# Patient Record
Sex: Male | Born: 1960 | Race: White | Hispanic: No | Marital: Single | Smoking: Never smoker
Health system: Southern US, Community
[De-identification: ages and names within clinical notes are randomized; demographics above are authoritative.]

## PROBLEM LIST (undated history)

## (undated) DIAGNOSIS — F79 Unspecified intellectual disabilities: Secondary | ICD-10-CM

## (undated) DIAGNOSIS — F259 Schizoaffective disorder, unspecified: Secondary | ICD-10-CM

## (undated) DIAGNOSIS — Z973 Presence of spectacles and contact lenses: Secondary | ICD-10-CM

## (undated) DIAGNOSIS — R569 Unspecified convulsions: Secondary | ICD-10-CM

## (undated) HISTORY — PX: CHOLECYSTECTOMY: SHX55

---

## 2010-03-30 ENCOUNTER — Inpatient Hospital Stay (HOSPITAL_COMMUNITY): Admission: EM | Admit: 2010-03-30 | Discharge: 2010-04-01 | Payer: Self-pay | Admitting: Emergency Medicine

## 2010-03-31 ENCOUNTER — Encounter (INDEPENDENT_AMBULATORY_CARE_PROVIDER_SITE_OTHER): Payer: Self-pay | Admitting: Internal Medicine

## 2010-12-25 LAB — CBC
HCT: 46.1 % (ref 39.0–52.0)
Hemoglobin: 16.2 g/dL (ref 13.0–17.0)
MCHC: 34.8 g/dL (ref 30.0–36.0)
MCHC: 34.8 g/dL (ref 30.0–36.0)
MCV: 91.5 fL (ref 78.0–100.0)
Platelets: 92 10*3/uL — ABNORMAL LOW (ref 150–400)
Platelets: 93 10*3/uL — ABNORMAL LOW (ref 150–400)
RBC: 4.99 MIL/uL (ref 4.22–5.81)
RBC: 5.04 MIL/uL (ref 4.22–5.81)
RDW: 13.4 % (ref 11.5–15.5)
RDW: 13.9 % (ref 11.5–15.5)
WBC: 6.6 10*3/uL (ref 4.0–10.5)

## 2010-12-25 LAB — CULTURE, BLOOD (ROUTINE X 2): Culture: NO GROWTH

## 2010-12-25 LAB — DIFFERENTIAL
Basophils Absolute: 0 10*3/uL (ref 0.0–0.1)
Basophils Relative: 0 % (ref 0–1)
Eosinophils Absolute: 0.2 10*3/uL (ref 0.0–0.7)
Lymphs Abs: 1.1 10*3/uL (ref 0.7–4.0)
Monocytes Absolute: 0.7 10*3/uL (ref 0.1–1.0)
Monocytes Relative: 10 % (ref 3–12)
Neutro Abs: 9.3 10*3/uL — ABNORMAL HIGH (ref 1.7–7.7)
Neutrophils Relative %: 71 % (ref 43–77)
Neutrophils Relative %: 91 % — ABNORMAL HIGH (ref 43–77)

## 2010-12-25 LAB — RAPID URINE DRUG SCREEN, HOSP PERFORMED: Cocaine: NOT DETECTED

## 2010-12-25 LAB — URINALYSIS, ROUTINE W REFLEX MICROSCOPIC
Glucose, UA: 250 mg/dL — AB
Protein, ur: NEGATIVE mg/dL
Urobilinogen, UA: 0.2 mg/dL (ref 0.0–1.0)

## 2010-12-25 LAB — BLOOD GAS, ARTERIAL
Bicarbonate: 25.4 mEq/L — ABNORMAL HIGH (ref 20.0–24.0)
Patient temperature: 97.8
TCO2: 26.7 mmol/L (ref 0–100)
pH, Arterial: 7.42 (ref 7.350–7.450)
pO2, Arterial: 84.1 mmHg (ref 80.0–100.0)

## 2010-12-25 LAB — IRON AND TIBC: Iron: 116 ug/dL (ref 42–135)

## 2010-12-25 LAB — COMPREHENSIVE METABOLIC PANEL
ALT: 28 U/L (ref 0–53)
ALT: 36 U/L (ref 0–53)
AST: 20 U/L (ref 0–37)
Albumin: 3.3 g/dL — ABNORMAL LOW (ref 3.5–5.2)
Albumin: 3.9 g/dL (ref 3.5–5.2)
Alkaline Phosphatase: 67 U/L (ref 39–117)
CO2: 27 mEq/L (ref 19–32)
Calcium: 8.5 mg/dL (ref 8.4–10.5)
Calcium: 8.7 mg/dL (ref 8.4–10.5)
Chloride: 104 mEq/L (ref 96–112)
GFR calc Af Amer: >60 mL/min (ref >60)
GFR calc non Af Amer: >60 mL/min (ref >60)
Glucose, Bld: 132 mg/dL — ABNORMAL HIGH (ref 70–99)
Glucose, Bld: 93 mg/dL (ref 70–99)
Potassium: 3.6 mEq/L (ref 3.5–5.1)
Sodium: 135 mEq/L (ref 135–145)
Sodium: 140 mEq/L (ref 135–145)
Total Bilirubin: 1.1 mg/dL (ref 0.3–1.2)
Total Protein: 5.5 g/dL — ABNORMAL LOW (ref 6.0–8.3)
Total Protein: 6.4 g/dL (ref 6.0–8.3)

## 2010-12-25 LAB — MRSA PCR SCREENING
MRSA by PCR: NEGATIVE
MRSA by PCR: NEGATIVE

## 2010-12-25 LAB — GLUCOSE, CAPILLARY
Glucose-Capillary: 102 mg/dL — ABNORMAL HIGH (ref 70–99)
Glucose-Capillary: 107 mg/dL — ABNORMAL HIGH (ref 70–99)
Glucose-Capillary: 113 mg/dL — ABNORMAL HIGH (ref 70–99)
Glucose-Capillary: 136 mg/dL — ABNORMAL HIGH (ref 70–99)
Glucose-Capillary: 150 mg/dL — ABNORMAL HIGH (ref 70–99)
Glucose-Capillary: 169 mg/dL — ABNORMAL HIGH (ref 70–99)

## 2010-12-25 LAB — LIPID PANEL
Cholesterol: 194 mg/dL (ref 0–200)
HDL: 57 mg/dL (ref >39)
LDL Cholesterol: 124 mg/dL — ABNORMAL HIGH (ref 0–99)
Total CHOL/HDL Ratio: 3.4 RATIO

## 2010-12-25 LAB — POCT I-STAT, CHEM 8
BUN: 10 mg/dL (ref 6–23)
Chloride: 101 mEq/L (ref 96–112)
Creatinine, Ser: 0.9 mg/dL (ref 0.4–1.5)
HCT: 47 % (ref 39.0–52.0)
Hemoglobin: 16 g/dL (ref 13.0–17.0)
Potassium: 4 mEq/L (ref 3.5–5.1)
TCO2: 27 mmol/L (ref 0–100)

## 2010-12-25 LAB — DIC (DISSEMINATED INTRAVASCULAR COAGULATION)PANEL
Fibrinogen: 338 mg/dL (ref 204–475)
INR: 0.99 (ref 0.00–1.49)
Smear Review: NONE SEEN
aPTT: 29 seconds (ref 24–37)

## 2010-12-25 LAB — URINE CULTURE: Culture: NO GROWTH

## 2010-12-25 LAB — AMMONIA: Ammonia: 18 umol/L (ref 11–35)

## 2010-12-25 LAB — TSH: TSH: 0.516 u[IU]/mL (ref 0.350–4.500)

## 2010-12-25 LAB — CARDIAC PANEL(CRET KIN+CKTOT+MB+TROPI)
Relative Index: INVALID (ref 0.0–2.5)
Troponin I: 0.02 ng/mL (ref 0.00–0.06)

## 2010-12-25 LAB — CK TOTAL AND CKMB (NOT AT ARMC)
CK, MB: 2.1 ng/mL (ref 0.3–4.0)
Relative Index: 1.8 (ref 0.0–2.5)
Total CK: 116 U/L (ref 7–232)

## 2010-12-25 LAB — FERRITIN: Ferritin: 194 ng/mL (ref 22–322)

## 2010-12-25 LAB — TECHNOLOGIST SMEAR REVIEW

## 2010-12-25 LAB — RETICULOCYTES: Retic Ct Pct: 1.1 % (ref 0.4–3.1)

## 2010-12-25 LAB — VITAMIN A: Vitamin A (Retinoic Acid): 72 ug/dL (ref 38–98)

## 2010-12-25 LAB — FOLATE: Folate: >20 ng/mL

## 2014-10-13 ENCOUNTER — Emergency Department (HOSPITAL_COMMUNITY)
Admission: EM | Admit: 2014-10-13 | Discharge: 2014-10-13 | Disposition: A | Discharge status: Home / Self Care | Payer: Medicare Other | Attending: Emergency Medicine | Admitting: Emergency Medicine

## 2014-10-13 ENCOUNTER — Emergency Department (HOSPITAL_COMMUNITY): Payer: Medicare Other

## 2014-10-13 ENCOUNTER — Encounter (HOSPITAL_COMMUNITY): Payer: Self-pay | Admitting: Emergency Medicine

## 2014-10-13 DIAGNOSIS — Y9389 Activity, other specified: Secondary | ICD-10-CM | POA: Insufficient documentation

## 2014-10-13 DIAGNOSIS — W19XXXA Unspecified fall, initial encounter: Secondary | ICD-10-CM

## 2014-10-13 DIAGNOSIS — S52611A Displaced fracture of right ulna styloid process, initial encounter for closed fracture: Secondary | ICD-10-CM | POA: Insufficient documentation

## 2014-10-13 DIAGNOSIS — M25531 Pain in right wrist: Secondary | ICD-10-CM | POA: Diagnosis not present

## 2014-10-13 DIAGNOSIS — F79 Unspecified intellectual disabilities: Secondary | ICD-10-CM | POA: Insufficient documentation

## 2014-10-13 DIAGNOSIS — Z7951 Long term (current) use of inhaled steroids: Secondary | ICD-10-CM | POA: Diagnosis not present

## 2014-10-13 DIAGNOSIS — Y998 Other external cause status: Secondary | ICD-10-CM | POA: Insufficient documentation

## 2014-10-13 DIAGNOSIS — S6991XA Unspecified injury of right wrist, hand and finger(s), initial encounter: Secondary | ICD-10-CM | POA: Diagnosis present

## 2014-10-13 DIAGNOSIS — S52571A Other intraarticular fracture of lower end of right radius, initial encounter for closed fracture: Secondary | ICD-10-CM | POA: Diagnosis not present

## 2014-10-13 DIAGNOSIS — Y92128 Other place in nursing home as the place of occurrence of the external cause: Secondary | ICD-10-CM | POA: Diagnosis not present

## 2014-10-13 DIAGNOSIS — Z79899 Other long term (current) drug therapy: Secondary | ICD-10-CM | POA: Insufficient documentation

## 2014-10-13 DIAGNOSIS — S52501A Unspecified fracture of the lower end of right radius, initial encounter for closed fracture: Secondary | ICD-10-CM

## 2014-10-13 DIAGNOSIS — W010XXA Fall on same level from slipping, tripping and stumbling without subsequent striking against object, initial encounter: Secondary | ICD-10-CM | POA: Insufficient documentation

## 2014-10-13 HISTORY — DX: Schizoaffective disorder, unspecified: F25.9

## 2014-10-13 HISTORY — DX: Unspecified intellectual disabilities: F79

## 2014-10-13 MED ORDER — HYDROCODONE-ACETAMINOPHEN 7.5-325 MG PO TABS: 1.0000 | ORAL_TABLET | ORAL | Status: DC | PRN

## 2014-10-13 MED ORDER — HYDROMORPHONE HCL 1 MG/ML IJ SOLN: 1.0000 mg | Freq: Once | INTRAMUSCULAR | Status: DC

## 2014-10-13 MED ORDER — HYDROCODONE-ACETAMINOPHEN 5-325 MG PO TABS
2.0000 | ORAL_TABLET | Freq: Once | ORAL | Status: AC
  Administered 2014-10-13: 2 via ORAL
  Filled 2014-10-13: qty 2

## 2014-10-13 MED ORDER — ONDANSETRON HCL 4 MG PO TABS
4.0000 mg | ORAL_TABLET | Freq: Once | ORAL | Status: AC
  Administered 2014-10-13: 4 mg via ORAL
  Filled 2014-10-13: qty 1

## 2014-10-13 NOTE — Discharge Instructions (Signed)
Gerald Pierce has a fracture of the wrist/forearm area that involves the joint. Please see Dr. Merlyn LotKuzma tomorrow for orthopedic evaluation and management. Please use Tylenol or ibuprofen for mild pain, use Norco for more severe pain. Norco may cause drowsiness, please use with caution. Please apply ice, and use the sling. Radial Fracture You have a broken bone (fracture) of the forearm. This is the part of your arm between the elbow and your wrist. Your forearm is made up of two bones. These are the radius and ulna. Your fracture is in the radial shaft. This is the bone in your forearm located on the thumb side. A cast or splint is used to protect and keep your injured bone from moving. The cast or splint will be on generally for about 5 to 6 weeks, with individual variations. HOME CARE INSTRUCTIONS   Keep the injured part elevated while sitting or lying down. Keep the injury above the level of your heart (the center of the chest). This will decrease swelling and pain.  Apply ice to the injury for 15-20 minutes, 03-04 times per day while awake, for 2 days. Put the ice in a plastic bag and place a towel between the bag of ice and your cast or splint.  Move your fingers to avoid stiffness and minimize swelling.  If you have a plaster or fiberglass cast:  Do not try to scratch the skin under the cast using sharp or pointed objects.  Check the skin around the cast every day. You may put lotion on any red or sore areas.  Keep your cast dry and clean.  If you have a plaster splint:  Wear the splint as directed.  You may loosen the elastic around the splint if your fingers become numb, tingle, or turn cold or blue.  Do not put pressure on any part of your cast or splint. It may break. Rest your cast only on a pillow for the first 24 hours until it is fully hardened.  Your cast or splint can be protected during bathing with a plastic bag. Do not lower the cast or splint into water.  Only take  over-the-counter or prescription medicines for pain, discomfort, or fever as directed by your caregiver. SEEK IMMEDIATE MEDICAL CARE IF:   Your cast gets damaged or breaks.  You have more severe pain or swelling than you did before getting the cast.  You have severe pain when stretching your fingers.  There is a bad smell, new stains and/or pus-like (purulent) drainage coming from under the cast.  Your fingers or hand turn pale or blue and become cold or your loose feeling. Document Released: 03/08/2006 Document Revised: 12/18/2011 Document Reviewed: 06/04/2006 [facility] Patient Information 2015 [employer], [place]. This information is not intended to replace advice given to you by your health care provider. Make sure you discuss any questions you have with your health care provider.

## 2014-10-13 NOTE — ED Notes (Signed)
Patient with no complaints at this time. Respirations even and unlabored. Skin warm/dry. Discharge instructions reviewed with patient at this time. Patient given opportunity to voice concerns/ask questions. Patient discharged at this time and left Emergency Department with steady gait.   

## 2014-10-13 NOTE — ED Provider Notes (Signed)
CSN: [id number]     Arrival date & time 10/13/14  0905 History   First MD Initiated Contact with Patient 10/13/14 0914     Chief Complaint  Patient presents with  . Wrist Pain     (Consider location/radiation/quality/duration/timing/severity/associated sxs/prior Treatment) HPI Comments: Patient stumbled and fell early this morning on an outstretched hand and injured the right wrist. The patient denies being on any anticoagulation medications. He has not had any previous operations procedures involving the right upper extremity. He has no history of any bleeding disorders.  Patient is a 54 y.o. male presenting with wrist pain. The history is provided by a caregiver and the patient.  Wrist Pain This is a new problem. The current episode started today. The problem occurs constantly. The problem has been gradually worsening. Associated symptoms include arthralgias. Pertinent negatives include no abdominal pain, chest pain, coughing, nausea, neck pain, numbness or vomiting. Exacerbated by: movement of wrist. He has tried nothing for the symptoms. The treatment provided no relief.    Past Medical History  Diagnosis Date  . Schizoaffective disorder   . Mental retardation    Past Surgical History  Procedure Laterality Date  . Cholecystectomy     No family history on file. History  Substance Use Topics  . Smoking status: Never Smoker   . Smokeless tobacco: Not on file  . Alcohol Use: No    Review of Systems  Constitutional: Negative for activity change.       All ROS Neg except as noted in HPI  HENT: Negative for nosebleeds.   Eyes: Negative for photophobia and discharge.  Respiratory: Negative for cough, shortness of breath and wheezing.   Cardiovascular: Negative for chest pain and palpitations.  Gastrointestinal: Negative for nausea, vomiting, abdominal pain and blood in stool.  Genitourinary: Negative for dysuria, frequency and hematuria.  Musculoskeletal: Positive for  arthralgias. Negative for back pain and neck pain.  Skin: Negative.   Neurological: Negative for dizziness, seizures, speech difficulty and numbness.  Psychiatric/Behavioral: Negative for hallucinations and confusion.      Allergies  Review of patient's allergies indicates no known allergies.  Home Medications   Prior to Admission medications   Medication Sig Start Date End Date Taking? Authorizing Provider  alum & mag hydroxide-simeth (MAALOX/MYLANTA) 200-200-20 MG/5ML suspension Take by mouth every 6 (six) hours as needed for indigestion or heartburn.   Yes Historical Provider, MD  betamethasone valerate (VALISONE) 0.1 % cream Apply topically 2 (two) times daily.   Yes Historical Provider, MD  bisacodyl (DULCOLAX) 5 MG EC tablet Take 5 mg by mouth daily as needed for moderate constipation.   Yes Historical Provider, MD  fexofenadine (ALLEGRA) 180 MG tablet Take 180 mg by mouth daily.   Yes Historical Provider, MD  fluticasone (VERAMYST) 27.5 MCG/SPRAY nasal spray Place 2 sprays into the nose daily.   Yes Historical Provider, MD  ibuprofen (ADVIL,MOTRIN) 800 MG tablet Take 800 mg by mouth every 8 (eight) hours as needed.   Yes Historical Provider, MD  lurasidone (LATUDA) 40 MG TABS tablet Take 20 mg by mouth daily at 6 PM.    Yes Historical Provider, MD  simvastatin (ZOCOR) 40 MG tablet Take 40 mg by mouth daily at 6 PM.    Yes Historical Provider, MD  vitamin C (ASCORBIC ACID) 500 MG tablet Take 500 mg by mouth daily.   Yes Historical Provider, MD   BP 127/70 mmHg  Pulse 80  Temp(Src) 97.7 F (36.5 C) (Oral)  Resp 14  Ht 5\' 6"  (1.676 m)  Wt 156 lb (70.761 kg)  BMI 25.19 kg/m2  SpO2 100% Physical Exam  Constitutional: He is oriented to person, place, and time. He appears well-developed and well-nourished.  Non-toxic appearance.  HENT:  Head: Normocephalic.  Right Ear: Tympanic membrane and external ear normal.  Left Ear: Tympanic membrane and external ear normal.  Eyes: EOM  and lids are normal. Pupils are equal, round, and reactive to light.  Neck: Normal range of motion. Neck supple. Carotid bruit is not present.  Cardiovascular: Normal rate, regular rhythm, normal heart sounds, intact distal pulses and normal pulses.   Pulmonary/Chest: Breath sounds normal. No respiratory distress.  Abdominal: Soft. Bowel sounds are normal. There is no tenderness. There is no guarding.  Musculoskeletal: Normal range of motion.  No clavicle deformity. Good ROM of the right shoulder. No deformity of the humerus or elbow. Deformity of the right wrist. FROM of the fingers of the right hand. Cap refill is less than 2 sec. Radial pulse 2+.  Lymphadenopathy:       Head (right side): No submandibular adenopathy present.       Head (left side): No submandibular adenopathy present.    He has no cervical adenopathy.  Neurological: He is alert and oriented to person, place, and time. He has normal strength. No cranial nerve deficit or sensory deficit.  Skin: Skin is warm and dry.  Psychiatric: He has a normal mood and affect. His speech is normal.  Nursing note and vitals reviewed.   ED Course  Procedures (including critical care time) Labs Review Labs Reviewed - No data to display  Imaging Review Dg Wrist Complete Right  10/13/2014   CLINICAL DATA:  Fall  EXAM: RIGHT WRIST - COMPLETE 3+ VIEW  COMPARISON:  None.  FINDINGS: Comminuted intra-articular fracture distal radius with dorsal displacement of fracture fragments. Small avulsion fracture of the ulnar styloid.  IMPRESSION: Comminuted intra-articular fracture distal radius with dorsal displacement.   Electronically Signed   By: Marlan Palauharles  Clark M.D.   On: 10/13/2014 09:49     EKG Interpretation None      MDM  Vital signs within normal limits. Pulse oximetry is 100% on room air. Within normal limits by my interpretation.  No neurovascular compromise noted of the right upper extremity.  X-ray of the right wrist reveals a  comminuted intra-articular fracture of the distal radius with dorsal displacement, there is also an avulsion fracture of the ulnar styloid. The case was discussed with mother Romeo AppleHarrison (orthopedics). He requests that the patient be evaluated by hand specialist. Case discussed with Dr. Merlyn LotKuzma, he requests patient be placed in a sugar tong splint and he will see the patient tomorrow morning in the office.  Patient will be treated with Norco 7.5 mg every 6 hours. Ice pack is been provided, sling is been provided, the patient is given instructions to see Dr. Merlyn LotKuzma tomorrow morning. The caregiver with the patient acknowledges understanding and [place].    Final diagnoses:  Fall    *I have reviewed nursing notes, vital signs, and all appropriate lab and imaging results for this patient.[address]**    Keymora Grillot M Lyndol Vanderheiden, PA-C 10/13/14 1152  Donnetta HutchingBrian Cook, MD 10/13/14 [phone]

## 2014-10-13 NOTE — ED Notes (Signed)
Patient given 800 mg Ibuprofen at approximately 0530 this morning for pain from injury.

## 2014-10-13 NOTE — ED Notes (Signed)
Patient from [facility] with c/o right wrist pain s/p trip and fall this morning. Patient alert/oriented to baseline. States he tried to catch himself when he tripped. +deformity and swelling.

## 2014-10-14 ENCOUNTER — Other Ambulatory Visit: Payer: Self-pay | Admitting: Orthopedic Surgery

## 2014-10-14 ENCOUNTER — Encounter (HOSPITAL_BASED_OUTPATIENT_CLINIC_OR_DEPARTMENT_OTHER): Payer: Self-pay | Admitting: *Deleted

## 2014-10-14 NOTE — Progress Notes (Signed)
Pt lives in group home-manager Geraldo PitterFikisha [phone] said he is able to sign his permit-is aware of what is going on-[facility] [place] caregiver will come with [phone]

## 2014-10-15 ENCOUNTER — Ambulatory Visit (HOSPITAL_BASED_OUTPATIENT_CLINIC_OR_DEPARTMENT_OTHER)
Admission: RE | Admit: 2014-10-15 | Discharge: 2014-10-15 | Disposition: A | Discharge status: Home / Self Care | Payer: Medicare Other | Source: Ambulatory Visit | Attending: Orthopedic Surgery | Admitting: Orthopedic Surgery

## 2014-10-15 ENCOUNTER — Ambulatory Visit (HOSPITAL_BASED_OUTPATIENT_CLINIC_OR_DEPARTMENT_OTHER): Payer: Medicare Other | Admitting: Anesthesiology

## 2014-10-15 ENCOUNTER — Encounter (HOSPITAL_BASED_OUTPATIENT_CLINIC_OR_DEPARTMENT_OTHER)
Admission: RE | Disposition: A | Discharge status: Home / Self Care | Payer: Self-pay | Source: Ambulatory Visit | Attending: Orthopedic Surgery

## 2014-10-15 ENCOUNTER — Encounter (HOSPITAL_BASED_OUTPATIENT_CLINIC_OR_DEPARTMENT_OTHER): Payer: Self-pay | Admitting: *Deleted

## 2014-10-15 DIAGNOSIS — S52501A Unspecified fracture of the lower end of right radius, initial encounter for closed fracture: Secondary | ICD-10-CM | POA: Diagnosis not present

## 2014-10-15 DIAGNOSIS — F259 Schizoaffective disorder, unspecified: Secondary | ICD-10-CM | POA: Diagnosis not present

## 2014-10-15 DIAGNOSIS — W1809XA Striking against other object with subsequent fall, initial encounter: Secondary | ICD-10-CM | POA: Diagnosis not present

## 2014-10-15 DIAGNOSIS — F79 Unspecified intellectual disabilities: Secondary | ICD-10-CM | POA: Diagnosis not present

## 2014-10-15 DIAGNOSIS — Z9049 Acquired absence of other specified parts of digestive tract: Secondary | ICD-10-CM | POA: Insufficient documentation

## 2014-10-15 DIAGNOSIS — R569 Unspecified convulsions: Secondary | ICD-10-CM | POA: Diagnosis not present

## 2014-10-15 HISTORY — DX: Unspecified convulsions: R56.9

## 2014-10-15 HISTORY — PX: OPEN REDUCTION INTERNAL FIXATION (ORIF) DISTAL RADIAL FRACTURE: SHX5989

## 2014-10-15 HISTORY — DX: Presence of spectacles and contact lenses: Z97.3

## 2014-10-15 LAB — POCT HEMOGLOBIN-HEMACUE: HEMOGLOBIN: 16.6 g/dL (ref 13.0–17.0)

## 2014-10-15 SURGERY — OPEN REDUCTION INTERNAL FIXATION (ORIF) DISTAL RADIUS FRACTURE
Anesthesia: Regional | Laterality: Right

## 2014-10-15 MED ORDER — CEFAZOLIN SODIUM-DEXTROSE 2-3 GM-% IV SOLR
2.0000 g | INTRAVENOUS | Status: AC
  Administered 2014-10-15: 2 g via INTRAVENOUS

## 2014-10-15 MED ORDER — CHLORHEXIDINE GLUCONATE 4 % EX LIQD: 60.0000 mL | Freq: Once | CUTANEOUS | Status: DC

## 2014-10-15 MED ORDER — LIDOCAINE HCL (CARDIAC) 20 MG/ML IV SOLN
INTRAVENOUS | Status: DC | PRN
  Administered 2014-10-15: 40 mg via INTRAVENOUS

## 2014-10-15 MED ORDER — LACTATED RINGERS IV SOLN
INTRAVENOUS | Status: DC
  Administered 2014-10-15 (×3): via INTRAVENOUS

## 2014-10-15 MED ORDER — MIDAZOLAM HCL 2 MG/2ML IJ SOLN
INTRAMUSCULAR | Status: AC
  Filled 2014-10-15: qty 2

## 2014-10-15 MED ORDER — CEFAZOLIN SODIUM-DEXTROSE 2-3 GM-% IV SOLR
INTRAVENOUS | Status: AC
  Filled 2014-10-15: qty 50

## 2014-10-15 MED ORDER — DEXAMETHASONE SODIUM PHOSPHATE 10 MG/ML IJ SOLN
INTRAMUSCULAR | Status: DC | PRN
  Administered 2014-10-15: 10 mg via INTRAVENOUS

## 2014-10-15 MED ORDER — PROPOFOL 10 MG/ML IV BOLUS
INTRAVENOUS | Status: DC | PRN
  Administered 2014-10-15: 150 mg via INTRAVENOUS

## 2014-10-15 MED ORDER — ONDANSETRON HCL 4 MG/2ML IJ SOLN
INTRAMUSCULAR | Status: DC | PRN
  Administered 2014-10-15: 4 mg via INTRAVENOUS

## 2014-10-15 MED ORDER — ROPIVACAINE HCL 5 MG/ML IJ SOLN
INTRAMUSCULAR | Status: DC | PRN
  Administered 2014-10-15: 30 mL via PERINEURAL

## 2014-10-15 MED ORDER — FENTANYL CITRATE 0.05 MG/ML IJ SOLN
INTRAMUSCULAR | Status: AC
  Filled 2014-10-15: qty 6

## 2014-10-15 MED ORDER — FENTANYL CITRATE 0.05 MG/ML IJ SOLN
50.0000 ug | INTRAMUSCULAR | Status: DC | PRN
  Administered 2014-10-15: 100 ug via INTRAVENOUS

## 2014-10-15 MED ORDER — OXYCODONE-ACETAMINOPHEN 5-325 MG PO TABS: ORAL_TABLET | ORAL | Status: DC

## 2014-10-15 MED ORDER — SODIUM CHLORIDE 0.9 % IJ SOLN
INTRAMUSCULAR | Status: AC
  Filled 2014-10-15: qty 10

## 2014-10-15 MED ORDER — PROMETHAZINE HCL 25 MG/ML IJ SOLN
6.2500 mg | INTRAMUSCULAR | Status: DC | PRN
Start: 2014-10-15 — End: 2014-10-15
  Administered 2014-10-15: 6.25 mg via INTRAVENOUS

## 2014-10-15 MED ORDER — PROMETHAZINE HCL 25 MG/ML IJ SOLN
INTRAMUSCULAR | Status: AC
  Filled 2014-10-15: qty 1

## 2014-10-15 MED ORDER — HYDROMORPHONE HCL 1 MG/ML IJ SOLN: 0.2500 mg | INTRAMUSCULAR | Status: DC | PRN

## 2014-10-15 MED ORDER — FENTANYL CITRATE 0.05 MG/ML IJ SOLN
INTRAMUSCULAR | Status: AC
  Filled 2014-10-15: qty 2

## 2014-10-15 MED ORDER — MIDAZOLAM HCL 2 MG/2ML IJ SOLN
1.0000 mg | INTRAMUSCULAR | Status: DC | PRN
  Administered 2014-10-15: 2 mg via INTRAVENOUS

## 2014-10-15 MED ORDER — PROPOFOL 10 MG/ML IV BOLUS
INTRAVENOUS | Status: AC
  Filled 2014-10-15: qty 20

## 2014-10-15 MED ORDER — 0.9 % SODIUM CHLORIDE (POUR BTL) OPTIME
TOPICAL | Status: DC | PRN
  Administered 2014-10-15: 1000 mL

## 2014-10-15 SURGICAL SUPPLY — 68 items
BANDAGE ELASTIC 3 VELCRO ST LF (GAUZE/BANDAGES/DRESSINGS) ×3 IMPLANT
BIT DRILL 2.0 LNG QUCK RELEASE (BIT) ×1 IMPLANT
BIT DRILL 2.8X5 QR DISP (BIT) ×3 IMPLANT
BLADE MINI RND TIP GREEN BEAV (BLADE) IMPLANT
BLADE SURG 15 STRL LF DISP TIS (BLADE) ×2 IMPLANT
BLADE SURG 15 STRL SS (BLADE) ×4
BNDG ESMARK 4X9 LF (GAUZE/BANDAGES/DRESSINGS) ×3 IMPLANT
BNDG GAUZE ELAST 4 BULKY (GAUZE/BANDAGES/DRESSINGS) ×3 IMPLANT
BNDG PLASTER X FAST 3X3 WHT LF (CAST SUPPLIES) ×3 IMPLANT
CHLORAPREP W/TINT 26ML (MISCELLANEOUS) ×3 IMPLANT
CORDS BIPOLAR (ELECTRODE) ×3 IMPLANT
COVER BACK TABLE 60X90IN (DRAPES) ×3 IMPLANT
COVER MAYO STAND STRL (DRAPES) ×3 IMPLANT
DRAPE EXTREMITY T 121X128X90 (DRAPE) ×3 IMPLANT
DRAPE OEC MINIVIEW 54X84 (DRAPES) ×3 IMPLANT
DRAPE SURG 17X23 STRL (DRAPES) ×3 IMPLANT
DRILL 2.0 LNG QUICK RELEASE (BIT) ×3
GAUZE SPONGE 4X4 12PLY STRL (GAUZE/BANDAGES/DRESSINGS) ×3 IMPLANT
GAUZE XEROFORM 1X8 LF (GAUZE/BANDAGES/DRESSINGS) ×3 IMPLANT
GLOVE BIO SURGEON STRL SZ7.5 (GLOVE) ×3 IMPLANT
GLOVE BIOGEL PI IND STRL 8 (GLOVE) ×1 IMPLANT
GLOVE BIOGEL PI IND STRL 8.5 (GLOVE) IMPLANT
GLOVE BIOGEL PI INDICATOR 8 (GLOVE) ×2
GLOVE BIOGEL PI INDICATOR 8.5 (GLOVE)
GLOVE SURG ORTHO 8.0 STRL STRW (GLOVE) IMPLANT
GOWN STRL REUS W/ TWL LRG LVL3 (GOWN DISPOSABLE) ×1 IMPLANT
GOWN STRL REUS W/TWL LRG LVL3 (GOWN DISPOSABLE) ×2
GOWN STRL REUS W/TWL XL LVL3 (GOWN DISPOSABLE) ×3 IMPLANT
GUIDEWIRE ORTHO 0.054X6 (WIRE) ×3 IMPLANT
NEEDLE HYPO 25X1 1.5 SAFETY (NEEDLE) IMPLANT
NS IRRIG 1000ML POUR BTL (IV SOLUTION) ×3 IMPLANT
PACK BASIN DAY SURGERY FS (CUSTOM PROCEDURE TRAY) ×3 IMPLANT
PAD CAST 3X4 CTTN HI CHSV (CAST SUPPLIES) ×1 IMPLANT
PADDING CAST ABS 4INX4YD NS (CAST SUPPLIES) ×2
PADDING CAST ABS COTTON 4X4 ST (CAST SUPPLIES) ×1 IMPLANT
PADDING CAST COTTON 3X4 STRL (CAST SUPPLIES) ×2
PLATE STD RT ACULOC 2 (Plate) ×3 IMPLANT
SCREW ACTK 2 NL HEX 3.5.11 (Screw) ×3 IMPLANT
SCREW CORT FT 20X2.3XLCK HEX (Screw) ×2 IMPLANT
SCREW CORTICAL LOCKING 2.3X18M (Screw) ×2 IMPLANT
SCREW CORTICAL LOCKING 2.3X20M (Screw) ×6 IMPLANT
SCREW CORTICAL LOCKING 2.3X22M (Screw) ×6 IMPLANT
SCREW FX18X2.3XSMTH LCK NS CRT (Screw) ×1 IMPLANT
SCREW FX20X2.3XSMTH LCK NS CRT (Screw) ×1 IMPLANT
SCREW FX22X2.3XLCK SMTH NS CRT (Screw) ×3 IMPLANT
SCREW NLCKG 13 3.5X13 HEXA (Screw) ×1 IMPLANT
SCREW NON-LOCK 3.5X13 (Screw) ×3 IMPLANT
SCREW NONLOCK HEX 3.5X12 (Screw) ×3 IMPLANT
SLEEVE SCD COMPRESS KNEE MED (MISCELLANEOUS) IMPLANT
SLING ARM MED ADULT FOAM STRAP (SOFTGOODS) ×3 IMPLANT
SPLINT PLASTER CAST XFAST 4X15 (CAST SUPPLIES) IMPLANT
SPLINT PLASTER XTRA FAST SET 4 (CAST SUPPLIES)
STOCKINETTE 4X48 STRL (DRAPES) ×3 IMPLANT
SUCTION FRAZIER TIP 10 FR DISP (SUCTIONS) IMPLANT
SUT ETHILON 3 0 PS 1 (SUTURE) IMPLANT
SUT ETHILON 4 0 PS 2 18 (SUTURE) ×6 IMPLANT
SUT VIC AB 2-0 SH 27 (SUTURE)
SUT VIC AB 2-0 SH 27XBRD (SUTURE) IMPLANT
SUT VIC AB 3-0 PS1 18 (SUTURE) ×2
SUT VIC AB 3-0 PS1 18XBRD (SUTURE) ×1 IMPLANT
SUT VICRYL 4-0 PS2 18IN ABS (SUTURE) ×3 IMPLANT
SYR BULB 3OZ (MISCELLANEOUS) ×3 IMPLANT
SYR CONTROL 10ML LL (SYRINGE) IMPLANT
TOWEL OR 17X24 6PK STRL BLUE (TOWEL DISPOSABLE) ×3 IMPLANT
TOWEL OR NON WOVEN STRL DISP B (DISPOSABLE) ×3 IMPLANT
TUBE CONNECTING 20'X1/4 (TUBING)
TUBE CONNECTING 20X1/4 (TUBING) IMPLANT
UNDERPAD 30X30 INCONTINENT (UNDERPADS AND DIAPERS) ×3 IMPLANT

## 2014-10-15 NOTE — Brief Op Note (Signed)
10/15/2014  1:25 PM  PATIENT:  Tomasita CrumbleSamuel Blumberg  54 y.o. male  PRE-OPERATIVE DIAGNOSIS:  RIGHT DISTAL RADIUS FRACTURE  POST-OPERATIVE DIAGNOSIS:  RIGHT DISTAL RADIUS FRACTURE  PROCEDURE:  Procedure(s): OPEN REDUCTION INTERNAL FIXATION (ORIF) RIGHT  DISTAL RADIAL FRACTURE (Right)  SURGEON:  Surgeon(s) and Role:    * Betha LoaKevin Dani Wallner, MD - Primary  PHYSICIAN ASSISTANT:   ASSISTANTS: Annye Ruskobert Dasnoit, PA   ANESTHESIA:   regional and general  EBL:  Total I/O In: 1500 [I.V.:1500] Out: -   BLOOD ADMINISTERED:none  DRAINS: none   LOCAL MEDICATIONS USED:  NONE  SPECIMEN:  No Specimen  DISPOSITION OF SPECIMEN:  N/A  COUNTS:  YES  TOURNIQUET:  < 60 minutes at 250 mmHg  DICTATION: .Other Dictation: Dictation Number [id number]  PLAN OF CARE: Discharge to home after PACU  PATIENT DISPOSITION:  PACU - hemodynamically stable.

## 2014-10-15 NOTE — Anesthesia Postprocedure Evaluation (Signed)
  Anesthesia Post-op Note  Patient: Gerald Pierce  Procedure(s) Performed: Procedure(s): OPEN REDUCTION INTERNAL FIXATION (ORIF) RIGHT  DISTAL RADIAL FRACTURE (Right)  Patient Location: PACU  Anesthesia Type: General, Regional   Level of Consciousness: awake, alert  and oriented  Airway and Oxygen Therapy: Patient Spontanous Breathing  Post-op Pain: none  Post-op Assessment: Post-op Vital signs reviewed  Post-op Vital Signs: Reviewed  Last Vitals:  Filed Vitals:   10/15/14 1415  BP: 130/77  Pulse: 86  Temp:   Resp: 15    Complications: No apparent anesthesia complications

## 2014-10-15 NOTE — Discharge Instructions (Addendum)
[facility] Center Instructions °Hand Surgery ° °Wound Care: °Keep your hand elevated above the level of your heart.  Do not allow it to dangle by your side.  Keep the dressing dry and do not remove it unless your doctor advises you to do so.  He will usually change it at the time of your post-op visit.  Moving your fingers is advised to stimulate circulation but will depend on the site of your surgery.  If you have a splint applied, your doctor will advise you regarding movement. ° °Activity: °Do not drive or operate machinery today.  Rest today and then you may return to your normal activity and work as indicated by your physician. ° °Diet:  °Drink liquids today or eat a light diet.  You may resume a regular diet tomorrow.   ° °General expectations: °Pain for two to three days. °Fingers may become slightly swollen. ° °Call your doctor if any of the following occur: °Severe pain not relieved by pain medication. °Elevated temperature. °Dressing soaked with blood. °Inability to move fingers. °White or bluish color to fingers. ° ° °Regional Anesthesia Blocks ° °1. Numbness or the inability to move the "blocked" extremity may last from 3-48 hours after placement. The length of time depends on the medication injected and your individual response to the medication. If the numbness is not going away after 48 hours, call your surgeon. ° °2. The extremity that is blocked will need to be protected until the numbness is gone and the  Strength has returned. Because you cannot feel it, you will need to take extra care to avoid injury. Because it may be weak, you may have difficulty moving it or using it. You may not know what position it is in without looking at it while the block is in effect. ° °3. For blocks in the legs and feet, returning to weight bearing and walking needs to be done carefully. You will need to wait until the numbness is entirely gone and the strength has returned. You should be able to move your leg and foot  normally before you try and bear weight or walk. You will need someone to be with you when you first try to ensure you do not fall and possibly risk injury. ° °4. Bruising and tenderness at the needle site are common side effects and will resolve in a few days. ° °5. Persistent numbness or new problems with movement should be communicated to the surgeon or the [facility] ([phone])/ [facility] ([phone]). ° ° °Post Anesthesia Home Care Instructions ° °Activity: °Get plenty of rest for the remainder of the day. A responsible adult should stay with you for 24 hours following the procedure.  °For the next 24 hours, DO NOT: °-Drive a car °-Operate machinery °-Drink alcoholic beverages °-Take any medication unless instructed by your physician °-Make any legal decisions or sign important papers. ° °Meals: °Start with liquid foods such as gelatin or soup. Progress to regular foods as tolerated. Avoid greasy, spicy, heavy foods. If nausea and/or vomiting occur, drink only clear liquids until the nausea and/or vomiting subsides. Call your physician if vomiting continues. ° °Special Instructions/Symptoms: °Your throat may feel dry or sore from the anesthesia or the breathing tube placed in your throat during surgery. If this causes discomfort, gargle with warm salt water. The discomfort should disappear within 24 hours. ° °

## 2014-10-15 NOTE — Anesthesia Procedure Notes (Addendum)
Anesthesia Regional Block:  Supraclavicular block  Pre-Anesthetic Checklist: ,, timeout performed, Correct Patient, Correct Site, Correct Laterality, Correct Procedure, Correct Position, site marked, Risks and benefits discussed, pre-op evaluation, post-op pain management  Laterality: Right  Prep: Maximum Sterile Barrier Precautions used and chloraprep       Needles:  Injection technique: Single-shot  Needle Type: Echogenic Stimulator Needle     Needle Length: 5cm 5 cm Needle Gauge: 22 and 22 G    Additional Needles:  Procedures: ultrasound guided (picture in chart) Supraclavicular block Narrative:  Start time: 10/15/2014 10:15 AM End time: 10/15/2014 10:25 AM Injection made incrementally with aspirations every 5 mL.  Performed by: Personally  Anesthesiologist: Gaynelle AduFITZGERALD, WILLIAM E  Additional Notes: 2% Lidocaine skin wheel.    Procedure Name: LMA Insertion Date/Time: 10/15/2014 12:11 PM Performed by: Curly ShoresRAFT, Diasia Henken W Pre-anesthesia Checklist: Patient identified, Emergency Drugs available, Suction available and Patient being monitored Patient Re-evaluated:Patient Re-evaluated prior to inductionOxygen Delivery Method: Circle System Utilized Preoxygenation: Pre-oxygenation with 100% oxygen Intubation Type: IV induction Ventilation: Mask ventilation without difficulty LMA: LMA inserted LMA Size: 5.0 Number of attempts: 1 Airway Equipment and Method: bite block Placement Confirmation: positive ETCO2 and breath sounds checked- equal and bilateral Tube secured with: Tape Dental Injury: Teeth and Oropharynx as per pre-operative assessment

## 2014-10-15 NOTE — Progress Notes (Signed)
Assisted Dr. Edmond Fitzgerald with right, ultrasound guided, supraclavicular block. Side rails up, monitors on throughout procedure. See vital signs in flow sheet. Tolerated Procedure well. 

## 2014-10-15 NOTE — Anesthesia Preprocedure Evaluation (Addendum)
Anesthesia Evaluation  Patient identified by MRN, date of birth, ID band Patient awake    Reviewed: Allergy & Precautions, H&P , NPO status , Patient's Chart, lab work & pertinent test results  Airway Mallampati: II  TM Distance: >3 FB Neck ROM: Full    Dental no notable dental hx. (+) Teeth Intact, Dental Advisory Given   Pulmonary neg pulmonary ROS,  breath sounds clear to auscultation  Pulmonary exam normal       Cardiovascular negative cardio ROS  Rhythm:Regular Rate:Normal     Neuro/Psych Seizures -, Well Controlled,  Schizophrenia    GI/Hepatic negative GI ROS, Neg liver ROS,   Endo/Other  negative endocrine ROS  Renal/GU negative Renal ROS  negative genitourinary   Musculoskeletal   Abdominal   Peds  Hematology negative hematology ROS (+)   Anesthesia Other Findings   Reproductive/Obstetrics negative OB ROS                            Anesthesia Physical Anesthesia Plan  ASA: II  Anesthesia Plan: General and Regional   Post-op Pain Management:    Induction: Intravenous  Airway Management Planned: LMA  Additional Equipment:   Intra-op Plan:   Post-operative Plan:   Informed Consent: I have reviewed the patients History and Physical, chart, labs and discussed the procedure including the risks, benefits and alternatives for the proposed anesthesia with the patient or authorized representative who has indicated his/her understanding and acceptance.   Dental advisory given  Plan Discussed with: CRNA and Surgeon  Anesthesia Plan Comments:        Anesthesia Quick Evaluation

## 2014-10-15 NOTE — Op Note (Signed)
Intra-operative fluoroscopic images in the AP, lateral, and oblique views were taken and evaluated by myself.  Reduction and hardware placement were confirmed.  There was no intraarticular penetration of permanent hardware.  

## 2014-10-15 NOTE — Transfer of Care (Signed)
Immediate Anesthesia Transfer of Care Note  Patient: Gerald Pierce  Procedure(s) Performed: Procedure(s): OPEN REDUCTION INTERNAL FIXATION (ORIF) RIGHT  DISTAL RADIAL FRACTURE (Right)  Patient Location: PACU  Anesthesia Type:GA combined with regional for post-op pain  Level of Consciousness: awake, alert  and patient cooperative  Airway & Oxygen Therapy: Patient Spontanous Breathing and Patient connected to face mask oxygen  Post-op Assessment: Report given to PACU RN, Post -op Vital signs reviewed and stable and Patient moving all extremities  Post vital signs: Reviewed and stable  Complications: No apparent anesthesia complications

## 2014-10-15 NOTE — H&P (Signed)
  Gerald Pierce is an 54 y.o. Pierce.   Chief Complaint: right distal radius fracture HPI: Gerald Pierce states he tripped over mop bucket and fell on right arm 10/13/14.  Seen at [facility] where XR revealed revealed right distal radius fracture.  Splinted and followed up in office.  Reports possible previous injury to wrist.  Past Medical History  Diagnosis Date  . Schizoaffective disorder   . Mental retardation   . Wears glasses   . Seizures     hx 2011    Past Surgical History  Procedure Laterality Date  . Cholecystectomy      History reviewed. No pertinent family history. Social History:  reports that he has never smoked. He does not have any smokeless tobacco history on file. He reports that he does not drink alcohol or use illicit drugs.  Allergies: No Known Allergies  Medications Prior to Admission  Medication Sig Dispense Refill  . alum & mag hydroxide-simeth (MAALOX/MYLANTA) 200-200-20 MG/5ML suspension Take by mouth every 6 (six) hours as needed for indigestion or heartburn.    . betamethasone valerate (VALISONE) 0.1 % cream Apply topically 2 (two) times daily.    . bisacodyl (DULCOLAX) 5 MG EC tablet Take 5 mg by mouth daily as needed for moderate constipation.    . fexofenadine (ALLEGRA) 180 MG tablet Take 180 mg by mouth daily.    . fluticasone (VERAMYST) 27.5 MCG/SPRAY nasal spray Place 2 sprays into the nose daily.    Marland Kitchen. HYDROcodone-acetaminophen (NORCO) 7.5-325 MG per tablet Take 1 tablet by mouth every 4 (four) hours as needed. 20 tablet 0  . ibuprofen (ADVIL,MOTRIN) 800 MG tablet Take 800 mg by mouth every 8 (eight) hours as needed.    . lurasidone (LATUDA) 40 MG TABS tablet Take 20 mg by mouth daily at 6 PM.     . simvastatin (ZOCOR) 40 MG tablet Take 40 mg by mouth daily at 6 PM.     . vitamin C (ASCORBIC ACID) 500 MG tablet Take 500 mg by mouth daily.      No results found for this or any previous visit (from the past 48 hour(s)).  No results found.   A  comprehensive review of systems was negative.  Blood pressure 121/73, pulse 76, temperature 98.1 F (36.7 C), temperature source Oral, resp. rate 16, height 5\' 6"  (1.676 m), weight 69.967 kg (154 lb 4 oz), SpO2 97 %.  General appearance: alert, cooperative and appears stated age Head: Normocephalic, without obvious abnormality, atraumatic Neck: supple, symmetrical, trachea midline Resp: clear to auscultation bilaterally Cardio: regular rate and rhythm GI: non tender Extremities: intact sensation and capillary refill all digits.  +epl/fpl/io.  no wounds. Pulses: 2+ and symmetric Skin: Skin color, texture, turgor normal. No rashes or lesions Neurologic: Grossly normal Incision/Wound: none  Assessment/Plan Right distal radius fracture.  Non operative and operative treatment options were discussed with the patient and patient wishes to proceed with operative treatment. Risks, benefits, and alternatives of surgery were discussed and the patient agrees with the plan of care.   Britten Parady R 10/15/2014, 9:48 AM

## 2014-10-15 NOTE — Op Note (Signed)
[id number] 

## 2014-10-16 ENCOUNTER — Encounter (HOSPITAL_BASED_OUTPATIENT_CLINIC_OR_DEPARTMENT_OTHER): Payer: Self-pay | Admitting: Orthopedic Surgery

## 2014-10-16 NOTE — Op Note (Signed)
NAMMarland Kitchen:  Brent GeneralROBERTS, Demere              ACCOUNT NO.:  [id number]  MEDICAL RECORD NO.:  [id number]  LOCATION:                                 FACILITY:  PHYSICIAN:  Betha LoaKevin Jennylee Uehara, MD        DATE OF BIRTH:  10-27-1960  DATE OF PROCEDURE:  10/15/2014 DATE OF DISCHARGE:  10/15/2014                              OPERATIVE REPORT   PREOPERATIVE DIAGNOSIS:  Right comminuted intra-articular distal radius fracture.  POSTOPERATIVE DIAGNOSIS:  Right comminuted intra-articular distal radius fracture.  PROCEDURE:  Open reduction and internal fixation of right comminuted intra-articular distal radius fracture.  SURGEON:  Betha LoaKevin Annaya Bangert, MD  ASSISTANT:  Marveen Reeksobert J. Dasnoit, PA-C.  ANESTHESIA:  General with regional.  IV FLUIDS:  Per anesthesia flow sheet.  ESTIMATED BLOOD LOSS:  Minimal.  COMPLICATIONS:  None.  SPECIMENS:  None.  TOURNIQUET TIME:  Under 1 hour.  DISPOSITION:  Stable to PACU.  INDICATIONS:  Gerald Pierce is a 54 year old right-hand dominant male who fell over a mop bucket 2 days ago injuring his right wrist.  [facility] was [facility] where his radiographs were taken revealing a distal radius fracture.  He was placed in a splint and follow up in the office.  We discussed nonoperative and operative treatment options.  He wished to proceed with operative fixation. Risks, benefits, and alternatives of the surgery were discussed including the risk of blood loss; infection; damage to nerves, vessels, tendons, ligaments, bone; failure of surgery; need for additional surgery; complications with wound healing; continued pain; nonunion; malunion; and stiffness.  He voiced understanding of these risks and elected to proceed.  OPERATIVE COURSE:  After being identified preoperatively by myself, the patient and I agreed upon the procedure and site of procedure.  Surgical site was marked.  Risks, benefits, and alternatives of the surgery were reviewed and he wished to  proceed.  Surgical consent had been signed. He was given IV Ancef as preoperative antibiotic prophylaxis.  He was transported to the operating room and placed on the operating room table in supine position with the right upper extremity on an armboard. General anesthesia was induced by Anesthesiology.  A regional block had been performed by Anesthesia in preoperative holding.  His right upper extremity was prepped and draped in normal sterile orthopedic fashion. A surgical pause was performed between the surgeons, anesthesia, and operating staff, and all were in agreement as to the patient, procedure and site of procedure.  Tourniquet at the proximal aspect of the extremity was inflated to 250 mmHg after exsanguination of the limb with an Esmarch bandage.  Standard volar Sherilyn CooterHenry approach was used.  The bipolar electrocautery was used to obtain hemostasis in the subcutaneous tissues.  The superficial and deep portions of the FCR tendon sheath were incised and the FCR and FPL swept ulnarly to protect the palmar cutaneous branch of the median nerve.  The brachialis was released.  The pronator quadratus was released and elevated with a periosteal elevator. The fracture site was easily identified.  It was cleared of soft tissue interposition.  It was reduced under direct visualization.  C-arm was used in AP and lateral projections to ensure appropriate  reduction, which was the case.  A standard plate from the Acumed volar distal radial locking set was selected and secured to the bone with the guidepins.  Standard AO drilling and measuring technique was then used. C-arm was used to confirm appropriate placement, which was the case. Two screws were placed in the proximal aspect of the plate.  These were nonlocking screws.  The distal holes were filled with locking pegs with exception of the styloid holes, which were filled with locking screws. The remaining hole in the shaft of the plate was filled  with a nonlocking screw.  Good purchase was obtained.  C-arm was used in AP, lateral, and oblique projections to ensure appropriate reduction and position of hardware, which was the case.  The wrist was placed through range of motion.  Full pronation, supination was present.  The wound was copiously irrigated with sterile saline.  Pronator quadratus was repaired back over top of the plate with 3-0 Vicryl suture.  Single inverted interrupted Vicryl suture was placed in the subcutaneous tissues and the skin was closed with 4-0 nylon in a horizontal mattress fashion.  The wound was dressed with sterile Xeroform, 4x4s, and wrapped with a Kerlix bandage.  A volar splint was placed and wrapped with Kerlix and Ace bandage.  Tourniquet was deflated under 1 hour.  The operative drapes were broken down.  The patient was awakened from anesthesia safely.  He was transferred back to the stretcher and taken to PACU in stable condition.  I will see him back in the office 1 week for postoperative followup.  I will give him Percocet 5/325, 1-2 p.o. q.6 hours p.r.n. pain, dispensed #40.     Betha Loa, MD     KK/MEDQ  D:  10/15/2014  T:  10/16/2014  Job:  [id number]

## 2015-04-15 ENCOUNTER — Other Ambulatory Visit: Payer: Self-pay | Admitting: Orthopedic Surgery

## 2015-04-15 DIAGNOSIS — M25531 Pain in right wrist: Secondary | ICD-10-CM

## 2015-04-16 ENCOUNTER — Other Ambulatory Visit: Payer: Medicare Other

## 2015-04-22 ENCOUNTER — Ambulatory Visit
Admission: RE | Admit: 2015-04-22 | Discharge: 2015-04-22 | Disposition: A | Discharge status: Home / Self Care | Payer: Medicare Other | Source: Ambulatory Visit | Attending: Orthopedic Surgery | Admitting: Orthopedic Surgery

## 2015-04-22 DIAGNOSIS — M25531 Pain in right wrist: Secondary | ICD-10-CM

## 2015-05-05 ENCOUNTER — Other Ambulatory Visit: Payer: Self-pay | Admitting: Orthopedic Surgery

## 2015-05-10 ENCOUNTER — Encounter (HOSPITAL_BASED_OUTPATIENT_CLINIC_OR_DEPARTMENT_OTHER): Payer: Self-pay | Admitting: *Deleted

## 2015-05-10 NOTE — Progress Notes (Signed)
Patient lives in [facility]. I spoke with Shawn Route who is the manager and she gave me the patient's history and meds. She will bring and stay with patient and return to group home. He is cooperative.

## 2015-05-13 ENCOUNTER — Encounter (HOSPITAL_BASED_OUTPATIENT_CLINIC_OR_DEPARTMENT_OTHER): Payer: Self-pay | Admitting: *Deleted

## 2015-05-13 ENCOUNTER — Ambulatory Visit (HOSPITAL_BASED_OUTPATIENT_CLINIC_OR_DEPARTMENT_OTHER): Payer: Medicare Other | Admitting: Anesthesiology

## 2015-05-13 ENCOUNTER — Encounter (HOSPITAL_BASED_OUTPATIENT_CLINIC_OR_DEPARTMENT_OTHER)
Admission: RE | Disposition: A | Discharge status: Home / Self Care | Payer: Self-pay | Source: Ambulatory Visit | Attending: Orthopedic Surgery

## 2015-05-13 ENCOUNTER — Ambulatory Visit (HOSPITAL_BASED_OUTPATIENT_CLINIC_OR_DEPARTMENT_OTHER)
Admission: RE | Admit: 2015-05-13 | Discharge: 2015-05-13 | Disposition: A | Discharge status: Home / Self Care | Payer: Medicare Other | Source: Ambulatory Visit | Attending: Orthopedic Surgery | Admitting: Orthopedic Surgery

## 2015-05-13 DIAGNOSIS — X58XXXS Exposure to other specified factors, sequela: Secondary | ICD-10-CM | POA: Insufficient documentation

## 2015-05-13 DIAGNOSIS — S52501S Unspecified fracture of the lower end of right radius, sequela: Secondary | ICD-10-CM | POA: Diagnosis not present

## 2015-05-13 DIAGNOSIS — F79 Unspecified intellectual disabilities: Secondary | ICD-10-CM | POA: Insufficient documentation

## 2015-05-13 DIAGNOSIS — Z4589 Encounter for adjustment and management of other implanted devices: Secondary | ICD-10-CM | POA: Diagnosis not present

## 2015-05-13 HISTORY — PX: HARDWARE REMOVAL: SHX979

## 2015-05-13 LAB — POCT HEMOGLOBIN-HEMACUE: Hemoglobin: 16.4 g/dL (ref 13.0–17.0)

## 2015-05-13 SURGERY — REMOVAL, HARDWARE
Anesthesia: General | Site: Wrist | Laterality: Right

## 2015-05-13 MED ORDER — SCOPOLAMINE 1 MG/3DAYS TD PT72: 1.0000 | MEDICATED_PATCH | Freq: Once | TRANSDERMAL | Status: DC | PRN

## 2015-05-13 MED ORDER — MIDAZOLAM HCL 2 MG/2ML IJ SOLN
1.0000 mg | INTRAMUSCULAR | Status: DC | PRN
  Administered 2015-05-13: 1 mg via INTRAVENOUS

## 2015-05-13 MED ORDER — LACTATED RINGERS IV SOLN
INTRAVENOUS | Status: DC
  Administered 2015-05-13 (×2): via INTRAVENOUS

## 2015-05-13 MED ORDER — BUPIVACAINE HCL (PF) 0.25 % IJ SOLN
INTRAMUSCULAR | Status: AC
  Filled 2015-05-13: qty 30

## 2015-05-13 MED ORDER — ONDANSETRON HCL 4 MG/2ML IJ SOLN
INTRAMUSCULAR | Status: DC | PRN
  Administered 2015-05-13: 4 mg via INTRAVENOUS

## 2015-05-13 MED ORDER — HYDROMORPHONE HCL 1 MG/ML IJ SOLN: 0.2500 mg | INTRAMUSCULAR | Status: DC | PRN

## 2015-05-13 MED ORDER — DEXAMETHASONE SODIUM PHOSPHATE 10 MG/ML IJ SOLN
INTRAMUSCULAR | Status: DC | PRN
  Administered 2015-05-13: 8 mg via INTRAVENOUS

## 2015-05-13 MED ORDER — FENTANYL CITRATE (PF) 100 MCG/2ML IJ SOLN
50.0000 ug | INTRAMUSCULAR | Status: DC | PRN
  Administered 2015-05-13 (×2): 50 ug via INTRAVENOUS

## 2015-05-13 MED ORDER — OXYCODONE HCL 5 MG/5ML PO SOLN: 5.0000 mg | Freq: Once | ORAL | Status: AC | PRN

## 2015-05-13 MED ORDER — OXYCODONE HCL 5 MG PO TABS
ORAL_TABLET | ORAL | Status: AC
Start: 2015-05-13 — End: 2015-05-13
  Filled 2015-05-13: qty 1

## 2015-05-13 MED ORDER — CEFAZOLIN SODIUM-DEXTROSE 2-3 GM-% IV SOLR
2.0000 g | INTRAVENOUS | Status: AC
  Administered 2015-05-13: 2 g via INTRAVENOUS

## 2015-05-13 MED ORDER — CEFAZOLIN SODIUM-DEXTROSE 2-3 GM-% IV SOLR
INTRAVENOUS | Status: AC
  Filled 2015-05-13: qty 50

## 2015-05-13 MED ORDER — FENTANYL CITRATE (PF) 100 MCG/2ML IJ SOLN
INTRAMUSCULAR | Status: AC
  Filled 2015-05-13: qty 4

## 2015-05-13 MED ORDER — OXYCODONE HCL 5 MG PO TABS
5.0000 mg | ORAL_TABLET | Freq: Once | ORAL | Status: AC | PRN
  Administered 2015-05-13: 5 mg via ORAL

## 2015-05-13 MED ORDER — GLYCOPYRROLATE 0.2 MG/ML IJ SOLN: 0.2000 mg | Freq: Once | INTRAMUSCULAR | Status: DC | PRN

## 2015-05-13 MED ORDER — MEPERIDINE HCL 25 MG/ML IJ SOLN: 6.2500 mg | INTRAMUSCULAR | Status: DC | PRN

## 2015-05-13 MED ORDER — PROPOFOL 10 MG/ML IV BOLUS
INTRAVENOUS | Status: DC | PRN
  Administered 2015-05-13: 200 mg via INTRAVENOUS

## 2015-05-13 MED ORDER — CHLORHEXIDINE GLUCONATE 4 % EX LIQD: 60.0000 mL | Freq: Once | CUTANEOUS | Status: DC

## 2015-05-13 MED ORDER — HYDROCODONE-ACETAMINOPHEN 5-325 MG PO TABS: ORAL_TABLET | ORAL | Status: DC

## 2015-05-13 MED ORDER — MIDAZOLAM HCL 2 MG/2ML IJ SOLN
INTRAMUSCULAR | Status: AC
  Filled 2015-05-13: qty 2

## 2015-05-13 MED ORDER — LIDOCAINE HCL (CARDIAC) 20 MG/ML IV SOLN
INTRAVENOUS | Status: DC | PRN
  Administered 2015-05-13: 50 mg via INTRAVENOUS

## 2015-05-13 MED ORDER — BUPIVACAINE HCL (PF) 0.25 % IJ SOLN
INTRAMUSCULAR | Status: DC | PRN
  Administered 2015-05-13: 8 mL

## 2015-05-13 SURGICAL SUPPLY — 44 items
BANDAGE ELASTIC 3 VELCRO ST LF (GAUZE/BANDAGES/DRESSINGS) ×3 IMPLANT
BLADE MINI RND TIP GREEN BEAV (BLADE) IMPLANT
BLADE SURG 15 STRL LF DISP TIS (BLADE) ×2 IMPLANT
BLADE SURG 15 STRL SS (BLADE) ×4
BNDG COHESIVE 1X5 TAN STRL LF (GAUZE/BANDAGES/DRESSINGS) IMPLANT
BNDG ELASTIC 2 VLCR STRL LF (GAUZE/BANDAGES/DRESSINGS) IMPLANT
BNDG ESMARK 4X9 LF (GAUZE/BANDAGES/DRESSINGS) IMPLANT
BNDG GAUZE ELAST 4 BULKY (GAUZE/BANDAGES/DRESSINGS) ×3 IMPLANT
CHLORAPREP W/TINT 26ML (MISCELLANEOUS) ×3 IMPLANT
CLOSURE WOUND 1/4X4 (GAUZE/BANDAGES/DRESSINGS)
CORDS BIPOLAR (ELECTRODE) ×3 IMPLANT
COVER BACK TABLE 60X90IN (DRAPES) ×3 IMPLANT
COVER MAYO STAND STRL (DRAPES) ×3 IMPLANT
CUFF TOURNIQUET SINGLE 18IN (TOURNIQUET CUFF) ×3 IMPLANT
DECANTER SPIKE VIAL GLASS SM (MISCELLANEOUS) ×3 IMPLANT
DRAPE EXTREMITY T 121X128X90 (DRAPE) ×3 IMPLANT
DRAPE SURG 17X23 STRL (DRAPES) ×3 IMPLANT
DRSG PAD ABDOMINAL 8X10 ST (GAUZE/BANDAGES/DRESSINGS) IMPLANT
GAUZE SPONGE 4X4 12PLY STRL (GAUZE/BANDAGES/DRESSINGS) ×3 IMPLANT
GAUZE XEROFORM 1X8 LF (GAUZE/BANDAGES/DRESSINGS) ×3 IMPLANT
GLOVE BIO SURGEON STRL SZ7.5 (GLOVE) ×3 IMPLANT
GLOVE BIOGEL PI IND STRL 8 (GLOVE) ×1 IMPLANT
GLOVE BIOGEL PI INDICATOR 8 (GLOVE) ×2
GOWN STRL REUS W/ TWL LRG LVL3 (GOWN DISPOSABLE) ×1 IMPLANT
GOWN STRL REUS W/TWL LRG LVL3 (GOWN DISPOSABLE) ×2
GOWN STRL REUS W/TWL XL LVL3 (GOWN DISPOSABLE) ×3 IMPLANT
NEEDLE HYPO 25X1 1.5 SAFETY (NEEDLE) IMPLANT
NS IRRIG 1000ML POUR BTL (IV SOLUTION) ×3 IMPLANT
PACK BASIN DAY SURGERY FS (CUSTOM PROCEDURE TRAY) ×3 IMPLANT
PAD CAST 3X4 CTTN HI CHSV (CAST SUPPLIES) IMPLANT
PADDING CAST ABS 4INX4YD NS (CAST SUPPLIES) ×2
PADDING CAST ABS COTTON 4X4 ST (CAST SUPPLIES) ×1 IMPLANT
PADDING CAST COTTON 3X4 STRL (CAST SUPPLIES)
SPLINT PLASTER CAST XFAST 3X15 (CAST SUPPLIES) IMPLANT
SPLINT PLASTER XTRA FASTSET 3X (CAST SUPPLIES)
STOCKINETTE 4X48 STRL (DRAPES) ×3 IMPLANT
STRIP CLOSURE SKIN 1/4X4 (GAUZE/BANDAGES/DRESSINGS) IMPLANT
SUT ETHILON 4 0 PS 2 18 (SUTURE) ×3 IMPLANT
SUT MNCRL AB 4-0 PS2 18 (SUTURE) IMPLANT
SUT VIC AB 3-0 FS2 27 (SUTURE) IMPLANT
SYR BULB 3OZ (MISCELLANEOUS) ×3 IMPLANT
SYR CONTROL 10ML LL (SYRINGE) IMPLANT
TOWEL OR 17X24 6PK STRL BLUE (TOWEL DISPOSABLE) ×6 IMPLANT
UNDERPAD 30X30 (UNDERPADS AND DIAPERS) ×3 IMPLANT

## 2015-05-13 NOTE — Discharge Instructions (Addendum)

## 2015-05-13 NOTE — Op Note (Signed)
[id number]  

## 2015-05-13 NOTE — Transfer of Care (Signed)
Immediate Anesthesia Transfer of Care Note  Patient: Gerald Pierce  Procedure(s) Performed: Procedure(s): HARDWARE REMOVAL RIGHT WRIST (Right)  Patient Location: PACU  Anesthesia Type:General  Level of Consciousness: sedated  Airway & Oxygen Therapy: Patient Spontanous Breathing and Patient connected to face mask oxygen  Post-op Assessment: Report given to RN and Post -op Vital signs reviewed and stable  Post vital signs: Reviewed and stable  Last Vitals:  Filed Vitals:   05/13/15 0727  BP: 121/79  Pulse: 58  Temp: 36.6 C  Resp: 20    Complications: No apparent anesthesia complications

## 2015-05-13 NOTE — Op Note (Signed)
NAMEElita Pierce NO.:  [id number]  MEDICAL RECORD NO.:  [id number]  LOCATION:                                 FACILITY:  PHYSICIAN:  Betha Loa, MD             DATE OF BIRTH:  DATE OF PROCEDURE:  05/13/2015 DATE OF DISCHARGE:                              OPERATIVE REPORT   PREOPERATIVE DIAGNOSIS:  Right wrist retained hardware.  POSTOPERATIVE DIAGNOSIS:  Right wrist retained hardware.  PROCEDURE:  Removal of retained hardware, right wrist.  SURGEON:  Betha Loa, MD.  ASSISTANT:  None.  ANESTHESIA:  General.  IV FLUIDS:  Per anesthesia flow sheet.  ESTIMATED BLOOD LOSS:  Minimal.  COMPLICATIONS:  None.  SPECIMENS:  None.  TOURNIQUET TIME:  35 minutes.  DISPOSITION:  Stable to PACU.  INDICATIONS:  The patient is a 54 year old male who 7 months ago underwent open reduction and internal fixation of a right distal radius fracture.  He did well postoperatively.  He healed this fracture.  There was a prominence of the plate volarly.  I recommended removal of the plate for prevention of tendon injury.  Risks, benefits, and alternatives of the surgery were discussed including risk of blood loss; infection; damage to nerves, vessels, tendons, ligaments, bone; failure of surgery; need for additional surgery; complications with wound healing, continued pain, nonunion, malunion, stiffness.  He voiced understanding of these risks and elected to proceed.  DESCRIPTION OF PROCEDURE:  After being identified preoperatively by myself, the patient and I agreed upon the procedure and site of the procedure.  Surgical site was marked.  The risks, benefits, and alternatives of surgery were reviewed; and he wished to proceed. Surgical consent had been signed.  He was given IV Ancef as preoperative antibiotic prophylaxis.  He was transferred to the operating room and placed on the operating room table in supine position with the right upper extremity on arm  board.  General anesthesia was induced by Anesthesiology.  Right upper extremity was prepped and draped in normal sterile orthopedic fashion.  Surgical pause was performed between surgeons, Anesthesia, and operating room staff; and all were in agreement as to the patient, procedure, and site of procedure. Tourniquet at the proximal aspect of the extremity was inflated to 250 mmHg after exsanguination of the limb with Esmarch bandage.  The previous incision was followed securing the subcutaneous tissues by spreading technique.  There was scar formation.  The FPL and FCR were retracted ulnarly to protect the palmar cutaneous branch of the median nerve.  The median nerve was visible and was protected.  The pronator quadratus was incised.  It had healed down.  The plate was identified. It was cleared of soft tissue coverage.  The screws were removed using a screwdriver.  The plate was removed.  Rongeur was used to smooth out the bone.  The remaining hardware was present.  The wound was copiously irrigated with sterile saline.  The pronator quadratus was repaired with a running 3-0 Vicryl suture.  Two inverted interrupted Vicryl sutures were placed in the subcutaneous tissues, and the  skin was closed with 4- 0 nylon in a horizontal mattress fashion.  The wound was injected with 8 mL of 0.25% plain Marcaine aiding in postoperative analgesia.  It was then dressed with sterile Xeroform, 4 x 4, and wrapped with a Kerlix bandage.  A volar splint was placed and wrapped with Kerlix and Ace bandage.  Tourniquet was deflated at 35 minutes.  The fingertips were pink with brisk capillary refill after deflation of the tourniquet. Operative drapes were broken down.  The patient was awoken from anesthesia safely.  He was transferred back to the stretcher and taken to PACU in stable condition.  I will see him back in the office in 1 week for postoperative followup.  I will give Norco 5/325, 1-2 p.o.  q.6 hours p.r.n. pain, dispensed #30.     Betha Loa, MD     KK/MEDQ  D:  05/13/2015  T:  05/13/2015  Job:  [id number]

## 2015-05-13 NOTE — Anesthesia Procedure Notes (Signed)
Procedure Name: LMA Insertion Date/Time: 05/13/2015 8:37 AM Performed by: Caren Macadam Pre-anesthesia Checklist: Patient identified, Emergency Drugs available, Suction available and Patient being monitored Patient Re-evaluated:Patient Re-evaluated prior to inductionOxygen Delivery Method: Circle System Utilized Preoxygenation: Pre-oxygenation with 100% oxygen Intubation Type: IV induction Ventilation: Mask ventilation without difficulty LMA: LMA inserted LMA Size: 4.0 Number of attempts: 1 Airway Equipment and Method: Bite block Placement Confirmation: positive ETCO2 and breath sounds checked- equal and bilateral Tube secured with: Tape Dental Injury: Teeth and Oropharynx as per pre-operative assessment

## 2015-05-13 NOTE — Brief Op Note (Signed)
05/13/2015  9:24 AM  PATIENT:  Gerald Pierce  54 y.o. male  PRE-OPERATIVE DIAGNOSIS:  right wrist retained hardware  T84.192  POST-OPERATIVE DIAGNOSIS:  right wrist retained hardware  PROCEDURE:  Procedure(s): HARDWARE REMOVAL RIGHT WRIST (Right)  SURGEON:  Surgeon(s) and Role:    * Betha Loa, MD - Primary  PHYSICIAN ASSISTANT:   ASSISTANTS: none   ANESTHESIA:   general  EBL:  Total I/O In: 1000 [I.V.:1000] Out: -   BLOOD ADMINISTERED:none  DRAINS: none   LOCAL MEDICATIONS USED:  MARCAINE     SPECIMEN:  No Specimen  DISPOSITION OF SPECIMEN:  N/A  COUNTS:  YES  TOURNIQUET:   Total Tourniquet Time Documented: Forearm (Right) - 35 minutes Total: Forearm (Right) - 35 minutes   DICTATION: .Other Dictation: Dictation Number [id number]  PLAN OF CARE: Discharge to home after PACU  PATIENT DISPOSITION:  PACU - hemodynamically stable.

## 2015-05-13 NOTE — H&P (Signed)
  Gerald Pierce is an 54 y.o. male.   Chief Complaint: retained hardware HPI: 54 yo rhd male 7 months s/p orif right distal radius fracture.  Has done well.  Fracture healed.  Hardware prominent volarly.  He wishes to have removal of hardware to reduce risk of tendon injury.  Past Medical History  Diagnosis Date  . Schizoaffective disorder   . Mental retardation   . Wears glasses   . Seizures     hx 2011    Past Surgical History  Procedure Laterality Date  . Cholecystectomy    . Open reduction internal fixation (orif) distal radial fracture Right 10/15/2014    Procedure: OPEN REDUCTION INTERNAL FIXATION (ORIF) RIGHT  DISTAL RADIAL FRACTURE;  Surgeon: Betha Loa, MD;  Location: [facility];  Service: Orthopedics;  Laterality: Right;    History reviewed. No pertinent family history. Social History:  reports that he has never smoked. He does not have any smokeless tobacco history on file. He reports that he does not drink alcohol or use illicit drugs.  Allergies: No Known Allergies  Medications Prior to Admission  Medication Sig Dispense Refill  . betamethasone valerate (VALISONE) 0.1 % cream Apply topically 2 (two) times daily.    . fexofenadine (ALLEGRA) 180 MG tablet Take 180 mg by mouth daily.    Marland Kitchen lurasidone (LATUDA) 40 MG TABS tablet Take 20 mg by mouth daily at 6 PM.     . simvastatin (ZOCOR) 40 MG tablet Take 40 mg by mouth daily at 6 PM.     . vitamin C (ASCORBIC ACID) 500 MG tablet Take 500 mg by mouth daily.    . bisacodyl (DULCOLAX) 5 MG EC tablet Take 5 mg by mouth daily as needed for moderate constipation.    Marland Kitchen ibuprofen (ADVIL,MOTRIN) 800 MG tablet Take 800 mg by mouth every 8 (eight) hours as needed.      Results for orders placed or performed during the hospital encounter of 05/13/15 (from the past 48 hour(s))  Hemoglobin-hemacue, POC     Status: None   Collection Time: 05/13/15  7:53 AM  Result Value Ref Range   Hemoglobin 16.4 13.0 - 17.0 g/dL     No results found.   A comprehensive review of systems was negative.  Blood pressure 121/79, pulse 58, temperature 97.8 F (36.6 C), temperature source Oral, resp. rate 20, height  (1.676 m), weight 68.493 kg (151 lb), SpO2 100 %.  General appearance: alert, cooperative and appears stated age Head: Normocephalic, without obvious abnormality, atraumatic Neck: supple, symmetrical, trachea midline Resp: clear to auscultation bilaterally Cardio: regular rate and rhythm GI: non tender Extremities: intact sensation and capillary refill all digits.  +epl/fpl/io.  no wounds. Pulses: 2+ and symmetric Skin: Skin color, texture, turgor normal. No rashes or lesions Neurologic: Grossly normal Incision/Wound:  none  Assessment/Plan Retained hardware right wrist.  Plan removal hardware.  Non operative and operative treatment options were discussed with the patient and patient wishes to proceed with operative treatment. Risks, benefits, and alternatives of surgery were discussed and the patient agrees with the plan of care.   Gerald Pierce R 05/13/2015, 8:23 AM

## 2015-05-13 NOTE — Anesthesia Preprocedure Evaluation (Signed)
Anesthesia Evaluation  Patient identified by MRN, date of birth, ID band Patient awake    Reviewed: Allergy & Precautions, NPO status , Patient's Chart, lab work & pertinent test results  Airway Mallampati: I  TM Distance: >3 FB Neck ROM: Full    Dental  (+) Teeth Intact, Dental Advisory Given   Pulmonary  breath sounds clear to auscultation        Cardiovascular Rhythm:Regular Rate:Normal     Neuro/Psych Seizures -, Well Controlled,     GI/Hepatic   Endo/Other    Renal/GU      Musculoskeletal   Abdominal   Peds  Hematology   Anesthesia Other Findings   Reproductive/Obstetrics                             Anesthesia Physical Anesthesia Plan  ASA: II  Anesthesia Plan: General   Post-op Pain Management:    Induction: Intravenous  Airway Management Planned: LMA  Additional Equipment:   Intra-op Plan:   Post-operative Plan: Extubation in OR  Informed Consent: I have reviewed the patients History and Physical, chart, labs and discussed the procedure including the risks, benefits and alternatives for the proposed anesthesia with the patient or authorized representative who has indicated his/her understanding and acceptance.   Dental advisory given  Plan Discussed with: CRNA, Anesthesiologist and Surgeon  Anesthesia Plan Comments:         Anesthesia Quick Evaluation

## 2015-05-14 ENCOUNTER — Encounter (HOSPITAL_BASED_OUTPATIENT_CLINIC_OR_DEPARTMENT_OTHER): Payer: Self-pay | Admitting: Orthopedic Surgery

## 2015-05-14 NOTE — Anesthesia Postprocedure Evaluation (Signed)
  Anesthesia Post-op Note  Patient: Gerald Pierce  Procedure(s) Performed: Procedure(s): HARDWARE REMOVAL RIGHT WRIST (Right)  Patient Location: PACU  Anesthesia Type: General   Level of Consciousness: awake, alert  and oriented  Airway and Oxygen Therapy: Patient Spontanous Breathing  Post-op Pain: mild  Post-op Assessment: Post-op Vital signs reviewed  Post-op Vital Signs: Reviewed  Last Vitals:  Filed Vitals:   05/13/15 1030  BP: 119/83  Pulse: 71  Temp: 36.7 C  Resp: 18    Complications: No apparent anesthesia complications

## 2015-10-21 DIAGNOSIS — F70 Mild intellectual disabilities: Secondary | ICD-10-CM | POA: Diagnosis not present

## 2015-10-25 DIAGNOSIS — Z79899 Other long term (current) drug therapy: Secondary | ICD-10-CM | POA: Diagnosis not present

## 2015-10-25 DIAGNOSIS — Z0000 Encounter for general adult medical examination without abnormal findings: Secondary | ICD-10-CM | POA: Diagnosis not present

## 2015-10-25 DIAGNOSIS — Z418 Encounter for other procedures for purposes other than remedying health state: Secondary | ICD-10-CM | POA: Diagnosis not present

## 2015-10-25 DIAGNOSIS — Z125 Encounter for screening for malignant neoplasm of prostate: Secondary | ICD-10-CM | POA: Diagnosis not present

## 2015-10-25 DIAGNOSIS — Z1211 Encounter for screening for malignant neoplasm of colon: Secondary | ICD-10-CM | POA: Diagnosis not present

## 2015-10-25 DIAGNOSIS — R5383 Other fatigue: Secondary | ICD-10-CM | POA: Diagnosis not present

## 2015-10-25 DIAGNOSIS — Z1389 Encounter for screening for other disorder: Secondary | ICD-10-CM | POA: Diagnosis not present

## 2015-10-25 DIAGNOSIS — E7800 Pure hypercholesterolemia, unspecified: Secondary | ICD-10-CM | POA: Diagnosis not present

## 2015-10-25 DIAGNOSIS — Z7189 Other specified counseling: Secondary | ICD-10-CM | POA: Diagnosis not present

## 2015-10-27 DIAGNOSIS — F209 Schizophrenia, unspecified: Secondary | ICD-10-CM | POA: Diagnosis not present

## 2015-10-27 DIAGNOSIS — F70 Mild intellectual disabilities: Secondary | ICD-10-CM | POA: Diagnosis not present

## 2015-12-23 DIAGNOSIS — J069 Acute upper respiratory infection, unspecified: Secondary | ICD-10-CM | POA: Diagnosis not present

## 2015-12-23 DIAGNOSIS — E7800 Pure hypercholesterolemia, unspecified: Secondary | ICD-10-CM | POA: Diagnosis not present

## 2015-12-23 DIAGNOSIS — F79 Unspecified intellectual disabilities: Secondary | ICD-10-CM | POA: Diagnosis not present

## 2016-01-10 DIAGNOSIS — R509 Fever, unspecified: Secondary | ICD-10-CM | POA: Diagnosis not present

## 2016-01-10 DIAGNOSIS — F79 Unspecified intellectual disabilities: Secondary | ICD-10-CM | POA: Diagnosis not present

## 2016-01-10 DIAGNOSIS — J09X2 Influenza due to identified novel influenza A virus with other respiratory manifestations: Secondary | ICD-10-CM | POA: Diagnosis not present

## 2016-01-10 DIAGNOSIS — Z87891 Personal history of nicotine dependence: Secondary | ICD-10-CM | POA: Diagnosis not present

## 2016-01-10 DIAGNOSIS — E7800 Pure hypercholesterolemia, unspecified: Secondary | ICD-10-CM | POA: Diagnosis not present

## 2016-01-10 DIAGNOSIS — Z299 Encounter for prophylactic measures, unspecified: Secondary | ICD-10-CM | POA: Diagnosis not present

## 2016-01-13 DIAGNOSIS — F70 Mild intellectual disabilities: Secondary | ICD-10-CM | POA: Diagnosis not present

## 2016-03-30 DIAGNOSIS — J309 Allergic rhinitis, unspecified: Secondary | ICD-10-CM | POA: Diagnosis not present

## 2016-03-30 DIAGNOSIS — Z299 Encounter for prophylactic measures, unspecified: Secondary | ICD-10-CM | POA: Diagnosis not present

## 2016-03-30 DIAGNOSIS — R7989 Other specified abnormal findings of blood chemistry: Secondary | ICD-10-CM | POA: Diagnosis not present

## 2016-03-30 DIAGNOSIS — F79 Unspecified intellectual disabilities: Secondary | ICD-10-CM | POA: Diagnosis not present

## 2016-03-30 DIAGNOSIS — B353 Tinea pedis: Secondary | ICD-10-CM | POA: Diagnosis not present

## 2016-05-04 DIAGNOSIS — F70 Mild intellectual disabilities: Secondary | ICD-10-CM | POA: Diagnosis not present

## 2016-08-02 DIAGNOSIS — Z23 Encounter for immunization: Secondary | ICD-10-CM | POA: Diagnosis not present

## 2016-08-04 DIAGNOSIS — F70 Mild intellectual disabilities: Secondary | ICD-10-CM | POA: Diagnosis not present

## 2016-08-25 DIAGNOSIS — Z789 Other specified health status: Secondary | ICD-10-CM | POA: Diagnosis not present

## 2016-08-25 DIAGNOSIS — Z6823 Body mass index (BMI) 23.0-23.9, adult: Secondary | ICD-10-CM | POA: Diagnosis not present

## 2016-08-25 DIAGNOSIS — Z299 Encounter for prophylactic measures, unspecified: Secondary | ICD-10-CM | POA: Diagnosis not present

## 2016-08-25 DIAGNOSIS — J329 Chronic sinusitis, unspecified: Secondary | ICD-10-CM | POA: Diagnosis not present

## 2016-08-25 DIAGNOSIS — D696 Thrombocytopenia, unspecified: Secondary | ICD-10-CM | POA: Diagnosis not present

## 2016-09-05 DIAGNOSIS — G44219 Episodic tension-type headache, not intractable: Secondary | ICD-10-CM | POA: Diagnosis not present

## 2016-09-05 DIAGNOSIS — H40033 Anatomical narrow angle, bilateral: Secondary | ICD-10-CM | POA: Diagnosis not present

## 2016-11-03 DIAGNOSIS — F70 Mild intellectual disabilities: Secondary | ICD-10-CM | POA: Diagnosis not present

## 2016-12-01 DIAGNOSIS — Z1211 Encounter for screening for malignant neoplasm of colon: Secondary | ICD-10-CM | POA: Diagnosis not present

## 2016-12-01 DIAGNOSIS — D696 Thrombocytopenia, unspecified: Secondary | ICD-10-CM | POA: Diagnosis not present

## 2016-12-01 DIAGNOSIS — Z299 Encounter for prophylactic measures, unspecified: Secondary | ICD-10-CM | POA: Diagnosis not present

## 2016-12-01 DIAGNOSIS — Z713 Dietary counseling and surveillance: Secondary | ICD-10-CM | POA: Diagnosis not present

## 2016-12-01 DIAGNOSIS — Z79899 Other long term (current) drug therapy: Secondary | ICD-10-CM | POA: Diagnosis not present

## 2016-12-01 DIAGNOSIS — Z87891 Personal history of nicotine dependence: Secondary | ICD-10-CM | POA: Diagnosis not present

## 2016-12-01 DIAGNOSIS — Z125 Encounter for screening for malignant neoplasm of prostate: Secondary | ICD-10-CM | POA: Diagnosis not present

## 2016-12-01 DIAGNOSIS — R5383 Other fatigue: Secondary | ICD-10-CM | POA: Diagnosis not present

## 2016-12-01 DIAGNOSIS — Z7189 Other specified counseling: Secondary | ICD-10-CM | POA: Diagnosis not present

## 2016-12-01 DIAGNOSIS — J309 Allergic rhinitis, unspecified: Secondary | ICD-10-CM | POA: Diagnosis not present

## 2016-12-01 DIAGNOSIS — E7800 Pure hypercholesterolemia, unspecified: Secondary | ICD-10-CM | POA: Diagnosis not present

## 2016-12-01 DIAGNOSIS — L309 Dermatitis, unspecified: Secondary | ICD-10-CM | POA: Diagnosis not present

## 2016-12-01 DIAGNOSIS — Z1389 Encounter for screening for other disorder: Secondary | ICD-10-CM | POA: Diagnosis not present

## 2016-12-01 DIAGNOSIS — Z0000 Encounter for general adult medical examination without abnormal findings: Secondary | ICD-10-CM | POA: Diagnosis not present

## 2017-01-12 DIAGNOSIS — F70 Mild intellectual disabilities: Secondary | ICD-10-CM | POA: Diagnosis not present

## 2017-03-19 DIAGNOSIS — Z6824 Body mass index (BMI) 24.0-24.9, adult: Secondary | ICD-10-CM | POA: Diagnosis not present

## 2017-03-19 DIAGNOSIS — F79 Unspecified intellectual disabilities: Secondary | ICD-10-CM | POA: Diagnosis not present

## 2017-03-19 DIAGNOSIS — E785 Hyperlipidemia, unspecified: Secondary | ICD-10-CM | POA: Diagnosis not present

## 2017-03-19 DIAGNOSIS — Z299 Encounter for prophylactic measures, unspecified: Secondary | ICD-10-CM | POA: Diagnosis not present

## 2017-03-19 DIAGNOSIS — L309 Dermatitis, unspecified: Secondary | ICD-10-CM | POA: Diagnosis not present

## 2017-04-06 IMAGING — CT CT WRIST*R* W/O CM
1 of 5 series · 2 of 14 positions shown, 3 images · non-contrast
Comparison: 10/13/2014

CLINICAL DATA: Fall in October 2014. Prior wrist surgery.
Assessment of healing fracture.

EXAM:
CT OF THE RIGHT WRIST WITHOUT CONTRAST
TECHNIQUE: Multidetector CT imaging of the right wrist was performed according
to the standard protocol. Multiplanar CT image reconstructions were
also generated.

[Series 303: axial soft · axial · 0.25mm/px · z∈[+2,+37]mm · 2 of 58 slices shown, 3 images]
[im 20/58  soft-tissue]
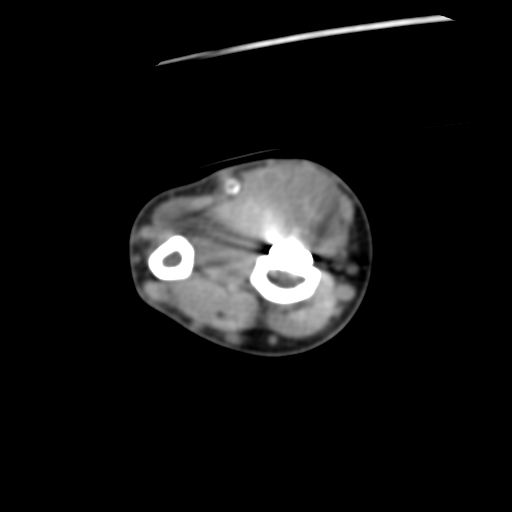
[im 20/58  bone]
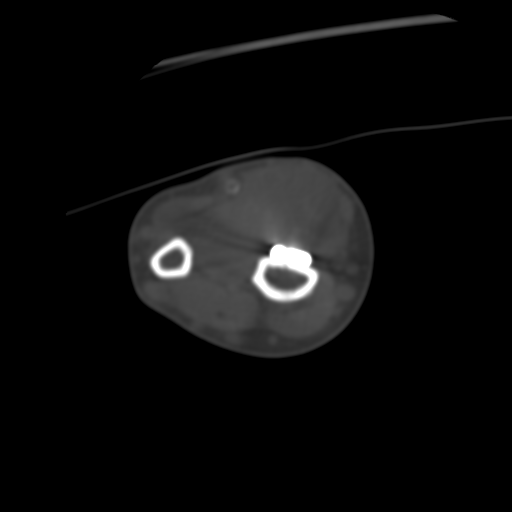
[im 39/58  bone]
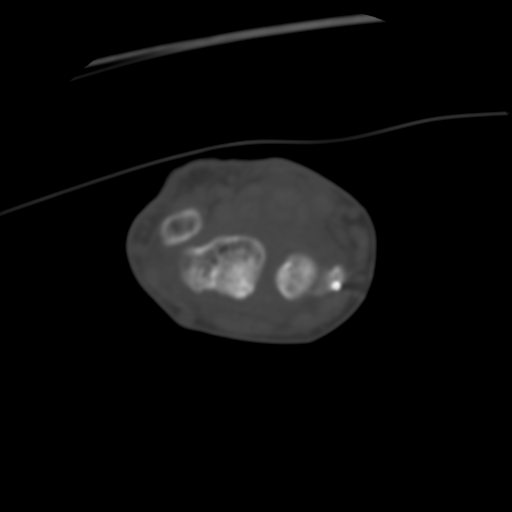

[2 of 14 positions shown; findings below may reference images not displayed]

FINDINGS: Acumed volar distal radial locking plate and screws set observed,
good position, no backing out of the screws, no hardware failure or
complicating feature.

Fusion along the fracture site noted. Expected good healing
response.

The scapholunate ligament is torn, with the scaphoid and lunate
separated by 4 mm, and with dorsal tilt of the lunate and
exaggerated scapholunate angle compatible with the AC. If this
worsens the patient may be at risk of SLAC wrist.

Carpal coalition between the lunate and capitate.

The ulnar styloid fracture has also healed.
IMPRESSION: 1. Good healing along the distal radial fracture site. Good
positioning of the Acumed plate without complicating feature.
2. Torn scapholunate ligament, scapholunate separation by 4 mm, and
dorsal tilt of the conjoined lunate/capitate with respect to the
scaphoid indicating do see. If this worsens there would be risk for
SLAC wrist.
3. Spur like appearance of the ulnar styloid compatible with healed
fracture.

## 2017-04-20 DIAGNOSIS — F70 Mild intellectual disabilities: Secondary | ICD-10-CM | POA: Diagnosis not present

## 2017-05-17 DIAGNOSIS — J069 Acute upper respiratory infection, unspecified: Secondary | ICD-10-CM | POA: Diagnosis not present

## 2017-05-17 DIAGNOSIS — J209 Acute bronchitis, unspecified: Secondary | ICD-10-CM | POA: Diagnosis not present

## 2017-08-03 DIAGNOSIS — F70 Mild intellectual disabilities: Secondary | ICD-10-CM | POA: Diagnosis not present

## 2017-08-10 DIAGNOSIS — Z23 Encounter for immunization: Secondary | ICD-10-CM | POA: Diagnosis not present

## 2017-09-04 DIAGNOSIS — H40033 Anatomical narrow angle, bilateral: Secondary | ICD-10-CM | POA: Diagnosis not present

## 2017-09-04 DIAGNOSIS — G44219 Episodic tension-type headache, not intractable: Secondary | ICD-10-CM | POA: Diagnosis not present

## 2017-11-09 DIAGNOSIS — F209 Schizophrenia, unspecified: Secondary | ICD-10-CM | POA: Diagnosis not present

## 2017-12-06 DIAGNOSIS — Z1339 Encounter for screening examination for other mental health and behavioral disorders: Secondary | ICD-10-CM | POA: Diagnosis not present

## 2017-12-06 DIAGNOSIS — E785 Hyperlipidemia, unspecified: Secondary | ICD-10-CM | POA: Diagnosis not present

## 2017-12-06 DIAGNOSIS — Z7189 Other specified counseling: Secondary | ICD-10-CM | POA: Diagnosis not present

## 2017-12-06 DIAGNOSIS — Z79899 Other long term (current) drug therapy: Secondary | ICD-10-CM | POA: Diagnosis not present

## 2017-12-06 DIAGNOSIS — Z125 Encounter for screening for malignant neoplasm of prostate: Secondary | ICD-10-CM | POA: Diagnosis not present

## 2017-12-06 DIAGNOSIS — Z0000 Encounter for general adult medical examination without abnormal findings: Secondary | ICD-10-CM | POA: Diagnosis not present

## 2017-12-06 DIAGNOSIS — Z1331 Encounter for screening for depression: Secondary | ICD-10-CM | POA: Diagnosis not present

## 2017-12-06 DIAGNOSIS — Z299 Encounter for prophylactic measures, unspecified: Secondary | ICD-10-CM | POA: Diagnosis not present

## 2017-12-06 DIAGNOSIS — D696 Thrombocytopenia, unspecified: Secondary | ICD-10-CM | POA: Diagnosis not present

## 2017-12-06 DIAGNOSIS — Z6824 Body mass index (BMI) 24.0-24.9, adult: Secondary | ICD-10-CM | POA: Diagnosis not present

## 2017-12-06 DIAGNOSIS — Z1211 Encounter for screening for malignant neoplasm of colon: Secondary | ICD-10-CM | POA: Diagnosis not present

## 2017-12-06 DIAGNOSIS — R5383 Other fatigue: Secondary | ICD-10-CM | POA: Diagnosis not present

## 2018-02-06 DIAGNOSIS — F209 Schizophrenia, unspecified: Secondary | ICD-10-CM | POA: Diagnosis not present

## 2018-03-06 DIAGNOSIS — Z6824 Body mass index (BMI) 24.0-24.9, adult: Secondary | ICD-10-CM | POA: Diagnosis not present

## 2018-03-06 DIAGNOSIS — L309 Dermatitis, unspecified: Secondary | ICD-10-CM | POA: Diagnosis not present

## 2018-03-06 DIAGNOSIS — Z713 Dietary counseling and surveillance: Secondary | ICD-10-CM | POA: Diagnosis not present

## 2018-03-06 DIAGNOSIS — Z299 Encounter for prophylactic measures, unspecified: Secondary | ICD-10-CM | POA: Diagnosis not present

## 2018-03-06 DIAGNOSIS — D696 Thrombocytopenia, unspecified: Secondary | ICD-10-CM | POA: Diagnosis not present

## 2018-04-03 DIAGNOSIS — E785 Hyperlipidemia, unspecified: Secondary | ICD-10-CM | POA: Diagnosis not present

## 2018-04-03 DIAGNOSIS — Z713 Dietary counseling and surveillance: Secondary | ICD-10-CM | POA: Diagnosis not present

## 2018-04-03 DIAGNOSIS — L309 Dermatitis, unspecified: Secondary | ICD-10-CM | POA: Diagnosis not present

## 2018-04-03 DIAGNOSIS — Z6824 Body mass index (BMI) 24.0-24.9, adult: Secondary | ICD-10-CM | POA: Diagnosis not present

## 2018-04-03 DIAGNOSIS — Z299 Encounter for prophylactic measures, unspecified: Secondary | ICD-10-CM | POA: Diagnosis not present

## 2018-05-15 DIAGNOSIS — F209 Schizophrenia, unspecified: Secondary | ICD-10-CM | POA: Diagnosis not present

## 2018-07-16 DIAGNOSIS — Z23 Encounter for immunization: Secondary | ICD-10-CM | POA: Diagnosis not present

## 2018-08-14 DIAGNOSIS — Z6825 Body mass index (BMI) 25.0-25.9, adult: Secondary | ICD-10-CM | POA: Diagnosis not present

## 2018-08-14 DIAGNOSIS — K589 Irritable bowel syndrome without diarrhea: Secondary | ICD-10-CM | POA: Diagnosis not present

## 2018-08-14 DIAGNOSIS — Z299 Encounter for prophylactic measures, unspecified: Secondary | ICD-10-CM | POA: Diagnosis not present

## 2018-08-14 DIAGNOSIS — E785 Hyperlipidemia, unspecified: Secondary | ICD-10-CM | POA: Diagnosis not present

## 2018-08-15 DIAGNOSIS — F209 Schizophrenia, unspecified: Secondary | ICD-10-CM | POA: Diagnosis not present

## 2018-11-10 ENCOUNTER — Other Ambulatory Visit: Payer: Self-pay

## 2018-11-10 ENCOUNTER — Encounter (HOSPITAL_COMMUNITY): Payer: Self-pay | Admitting: Emergency Medicine

## 2018-11-10 ENCOUNTER — Emergency Department (HOSPITAL_COMMUNITY)
Admission: EM | Admit: 2018-11-10 | Discharge: 2018-11-10 | Disposition: A | Discharge status: Home / Self Care | Payer: Medicare Other | Attending: Emergency Medicine | Admitting: Emergency Medicine

## 2018-11-10 DIAGNOSIS — J111 Influenza due to unidentified influenza virus with other respiratory manifestations: Secondary | ICD-10-CM | POA: Diagnosis not present

## 2018-11-10 DIAGNOSIS — R05 Cough: Secondary | ICD-10-CM | POA: Diagnosis present

## 2018-11-10 DIAGNOSIS — F259 Schizoaffective disorder, unspecified: Secondary | ICD-10-CM | POA: Insufficient documentation

## 2018-11-10 DIAGNOSIS — F79 Unspecified intellectual disabilities: Secondary | ICD-10-CM | POA: Diagnosis not present

## 2018-11-10 DIAGNOSIS — Z79899 Other long term (current) drug therapy: Secondary | ICD-10-CM | POA: Diagnosis not present

## 2018-11-10 DIAGNOSIS — Z9049 Acquired absence of other specified parts of digestive tract: Secondary | ICD-10-CM | POA: Diagnosis not present

## 2018-11-10 LAB — INFLUENZA PANEL BY PCR (TYPE A & B)
Influenza A By PCR: POSITIVE — AB
Influenza B By PCR: NEGATIVE

## 2018-11-10 MED ORDER — ACETAMINOPHEN 500 MG PO TABS
1000.0000 mg | ORAL_TABLET | Freq: Once | ORAL | Status: AC
  Administered 2018-11-10: 1000 mg via ORAL
  Filled 2018-11-10: qty 2

## 2018-11-10 MED ORDER — ONDANSETRON HCL 4 MG PO TABS: 4.0000 mg | ORAL_TABLET | Freq: Four times a day (QID) | ORAL | 0 refills | Status: DC

## 2018-11-10 MED ORDER — OSELTAMIVIR PHOSPHATE 75 MG PO CAPS: 75.0000 mg | ORAL_CAPSULE | Freq: Two times a day (BID) | ORAL | 0 refills | Status: DC

## 2018-11-10 MED ORDER — ONDANSETRON HCL 4 MG PO TABS
4.0000 mg | ORAL_TABLET | Freq: Once | ORAL | Status: DC
  Filled 2018-11-10: qty 1

## 2018-11-10 MED ORDER — OSELTAMIVIR PHOSPHATE 75 MG PO CAPS
75.0000 mg | ORAL_CAPSULE | Freq: Once | ORAL | Status: DC
  Filled 2018-11-10: qty 1

## 2018-11-10 NOTE — ED Triage Notes (Signed)
Pt c/o cough, congestion, chills, body aches since yesterday.

## 2018-11-10 NOTE — Discharge Instructions (Addendum)
Please use the Tamiflu 2 times daily with food.  Please use Tylenol extra strength every 4 hours for fever, and or aching.  Please use Zofran if needed for nausea.  Please wash hands frequently.  Increase of fluids.  See your primary physician for follow-up.  Return to the emergency department if any changes in your condition, problems, or concerns.
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
## 2018-12-12 DIAGNOSIS — Z1331 Encounter for screening for depression: Secondary | ICD-10-CM | POA: Diagnosis not present

## 2018-12-12 DIAGNOSIS — Z6835 Body mass index (BMI) 35.0-35.9, adult: Secondary | ICD-10-CM | POA: Diagnosis not present

## 2018-12-12 DIAGNOSIS — Z299 Encounter for prophylactic measures, unspecified: Secondary | ICD-10-CM | POA: Diagnosis not present

## 2018-12-12 DIAGNOSIS — Z1211 Encounter for screening for malignant neoplasm of colon: Secondary | ICD-10-CM | POA: Diagnosis not present

## 2018-12-12 DIAGNOSIS — Z0000 Encounter for general adult medical examination without abnormal findings: Secondary | ICD-10-CM | POA: Diagnosis not present

## 2018-12-12 DIAGNOSIS — R5383 Other fatigue: Secondary | ICD-10-CM | POA: Diagnosis not present

## 2018-12-12 DIAGNOSIS — F79 Unspecified intellectual disabilities: Secondary | ICD-10-CM | POA: Diagnosis not present

## 2018-12-12 DIAGNOSIS — Z7189 Other specified counseling: Secondary | ICD-10-CM | POA: Diagnosis not present

## 2018-12-12 DIAGNOSIS — Z1339 Encounter for screening examination for other mental health and behavioral disorders: Secondary | ICD-10-CM | POA: Diagnosis not present

## 2018-12-12 DIAGNOSIS — E7800 Pure hypercholesterolemia, unspecified: Secondary | ICD-10-CM | POA: Diagnosis not present

## 2018-12-18 DIAGNOSIS — Z79899 Other long term (current) drug therapy: Secondary | ICD-10-CM | POA: Diagnosis not present

## 2018-12-18 DIAGNOSIS — R5383 Other fatigue: Secondary | ICD-10-CM | POA: Diagnosis not present

## 2018-12-18 DIAGNOSIS — Z125 Encounter for screening for malignant neoplasm of prostate: Secondary | ICD-10-CM | POA: Diagnosis not present

## 2018-12-18 DIAGNOSIS — E7800 Pure hypercholesterolemia, unspecified: Secondary | ICD-10-CM | POA: Diagnosis not present

## 2019-03-20 DIAGNOSIS — Z299 Encounter for prophylactic measures, unspecified: Secondary | ICD-10-CM | POA: Diagnosis not present

## 2019-03-20 DIAGNOSIS — E785 Hyperlipidemia, unspecified: Secondary | ICD-10-CM | POA: Diagnosis not present

## 2019-03-20 DIAGNOSIS — Z6825 Body mass index (BMI) 25.0-25.9, adult: Secondary | ICD-10-CM | POA: Diagnosis not present

## 2019-03-20 DIAGNOSIS — B351 Tinea unguium: Secondary | ICD-10-CM | POA: Diagnosis not present

## 2019-03-20 DIAGNOSIS — M7732 Calcaneal spur, left foot: Secondary | ICD-10-CM | POA: Diagnosis not present

## 2019-05-12 DIAGNOSIS — M79672 Pain in left foot: Secondary | ICD-10-CM | POA: Diagnosis not present

## 2019-05-12 DIAGNOSIS — M722 Plantar fascial fibromatosis: Secondary | ICD-10-CM | POA: Diagnosis not present

## 2019-06-30 DIAGNOSIS — M722 Plantar fascial fibromatosis: Secondary | ICD-10-CM | POA: Diagnosis not present

## 2019-06-30 DIAGNOSIS — M79672 Pain in left foot: Secondary | ICD-10-CM | POA: Diagnosis not present

## 2019-09-05 DIAGNOSIS — Z23 Encounter for immunization: Secondary | ICD-10-CM | POA: Diagnosis not present

## 2019-10-21 DIAGNOSIS — F209 Schizophrenia, unspecified: Secondary | ICD-10-CM | POA: Diagnosis not present

## 2019-11-12 DIAGNOSIS — D696 Thrombocytopenia, unspecified: Secondary | ICD-10-CM | POA: Diagnosis not present

## 2019-11-12 DIAGNOSIS — Z299 Encounter for prophylactic measures, unspecified: Secondary | ICD-10-CM | POA: Diagnosis not present

## 2019-11-12 DIAGNOSIS — M79652 Pain in left thigh: Secondary | ICD-10-CM | POA: Diagnosis not present

## 2019-11-12 DIAGNOSIS — Z6823 Body mass index (BMI) 23.0-23.9, adult: Secondary | ICD-10-CM | POA: Diagnosis not present

## 2019-11-12 DIAGNOSIS — Z713 Dietary counseling and surveillance: Secondary | ICD-10-CM | POA: Diagnosis not present

## 2019-12-15 DIAGNOSIS — Z23 Encounter for immunization: Secondary | ICD-10-CM | POA: Diagnosis not present

## 2019-12-18 DIAGNOSIS — Z1331 Encounter for screening for depression: Secondary | ICD-10-CM | POA: Diagnosis not present

## 2019-12-18 DIAGNOSIS — Z6822 Body mass index (BMI) 22.0-22.9, adult: Secondary | ICD-10-CM | POA: Diagnosis not present

## 2019-12-18 DIAGNOSIS — Z0000 Encounter for general adult medical examination without abnormal findings: Secondary | ICD-10-CM | POA: Diagnosis not present

## 2019-12-18 DIAGNOSIS — Z7189 Other specified counseling: Secondary | ICD-10-CM | POA: Diagnosis not present

## 2019-12-18 DIAGNOSIS — Z299 Encounter for prophylactic measures, unspecified: Secondary | ICD-10-CM | POA: Diagnosis not present

## 2019-12-18 DIAGNOSIS — Z1339 Encounter for screening examination for other mental health and behavioral disorders: Secondary | ICD-10-CM | POA: Diagnosis not present

## 2019-12-18 DIAGNOSIS — Z1211 Encounter for screening for malignant neoplasm of colon: Secondary | ICD-10-CM | POA: Diagnosis not present

## 2020-01-13 DIAGNOSIS — Z23 Encounter for immunization: Secondary | ICD-10-CM | POA: Diagnosis not present

## 2020-03-22 DIAGNOSIS — D692 Other nonthrombocytopenic purpura: Secondary | ICD-10-CM | POA: Diagnosis not present

## 2020-03-22 DIAGNOSIS — G4700 Insomnia, unspecified: Secondary | ICD-10-CM | POA: Diagnosis not present

## 2020-03-22 DIAGNOSIS — Z6822 Body mass index (BMI) 22.0-22.9, adult: Secondary | ICD-10-CM | POA: Diagnosis not present

## 2020-03-22 DIAGNOSIS — F79 Unspecified intellectual disabilities: Secondary | ICD-10-CM | POA: Diagnosis not present

## 2020-03-22 DIAGNOSIS — Z299 Encounter for prophylactic measures, unspecified: Secondary | ICD-10-CM | POA: Diagnosis not present

## 2020-03-23 DIAGNOSIS — Z79899 Other long term (current) drug therapy: Secondary | ICD-10-CM | POA: Diagnosis not present

## 2020-03-23 DIAGNOSIS — E7800 Pure hypercholesterolemia, unspecified: Secondary | ICD-10-CM | POA: Diagnosis not present

## 2020-03-23 DIAGNOSIS — R5383 Other fatigue: Secondary | ICD-10-CM | POA: Diagnosis not present

## 2020-05-27 DIAGNOSIS — F209 Schizophrenia, unspecified: Secondary | ICD-10-CM | POA: Diagnosis not present

## 2020-07-08 DIAGNOSIS — F79 Unspecified intellectual disabilities: Secondary | ICD-10-CM | POA: Diagnosis not present

## 2020-07-08 DIAGNOSIS — E7849 Other hyperlipidemia: Secondary | ICD-10-CM | POA: Diagnosis not present

## 2020-08-25 DIAGNOSIS — F209 Schizophrenia, unspecified: Secondary | ICD-10-CM | POA: Diagnosis not present

## 2020-09-07 DIAGNOSIS — D699 Hemorrhagic condition, unspecified: Secondary | ICD-10-CM | POA: Diagnosis not present

## 2020-09-07 DIAGNOSIS — E7849 Other hyperlipidemia: Secondary | ICD-10-CM | POA: Diagnosis not present

## 2020-09-23 DIAGNOSIS — D692 Other nonthrombocytopenic purpura: Secondary | ICD-10-CM | POA: Diagnosis not present

## 2020-09-23 DIAGNOSIS — Z299 Encounter for prophylactic measures, unspecified: Secondary | ICD-10-CM | POA: Diagnosis not present

## 2020-09-23 DIAGNOSIS — D696 Thrombocytopenia, unspecified: Secondary | ICD-10-CM | POA: Diagnosis not present

## 2020-10-08 DIAGNOSIS — F79 Unspecified intellectual disabilities: Secondary | ICD-10-CM | POA: Diagnosis not present

## 2020-10-08 DIAGNOSIS — E7849 Other hyperlipidemia: Secondary | ICD-10-CM | POA: Diagnosis not present

## 2020-11-08 DIAGNOSIS — F79 Unspecified intellectual disabilities: Secondary | ICD-10-CM | POA: Diagnosis not present

## 2020-11-08 DIAGNOSIS — E7849 Other hyperlipidemia: Secondary | ICD-10-CM | POA: Diagnosis not present

## 2020-11-18 DIAGNOSIS — F209 Schizophrenia, unspecified: Secondary | ICD-10-CM | POA: Diagnosis not present

## 2021-01-05 DIAGNOSIS — I10 Essential (primary) hypertension: Secondary | ICD-10-CM | POA: Diagnosis not present

## 2021-01-05 DIAGNOSIS — F79 Unspecified intellectual disabilities: Secondary | ICD-10-CM | POA: Diagnosis not present

## 2021-02-16 DIAGNOSIS — F209 Schizophrenia, unspecified: Secondary | ICD-10-CM | POA: Diagnosis not present

## 2021-02-24 DIAGNOSIS — Z79899 Other long term (current) drug therapy: Secondary | ICD-10-CM | POA: Diagnosis not present

## 2021-02-24 DIAGNOSIS — Z6820 Body mass index (BMI) 20.0-20.9, adult: Secondary | ICD-10-CM | POA: Diagnosis not present

## 2021-02-24 DIAGNOSIS — Z1339 Encounter for screening examination for other mental health and behavioral disorders: Secondary | ICD-10-CM | POA: Diagnosis not present

## 2021-02-24 DIAGNOSIS — Z7189 Other specified counseling: Secondary | ICD-10-CM | POA: Diagnosis not present

## 2021-02-24 DIAGNOSIS — Z299 Encounter for prophylactic measures, unspecified: Secondary | ICD-10-CM | POA: Diagnosis not present

## 2021-02-24 DIAGNOSIS — R5383 Other fatigue: Secondary | ICD-10-CM | POA: Diagnosis not present

## 2021-02-24 DIAGNOSIS — Z125 Encounter for screening for malignant neoplasm of prostate: Secondary | ICD-10-CM | POA: Diagnosis not present

## 2021-02-24 DIAGNOSIS — Z1331 Encounter for screening for depression: Secondary | ICD-10-CM | POA: Diagnosis not present

## 2021-02-24 DIAGNOSIS — E7800 Pure hypercholesterolemia, unspecified: Secondary | ICD-10-CM | POA: Diagnosis not present

## 2021-02-24 DIAGNOSIS — Z0000 Encounter for general adult medical examination without abnormal findings: Secondary | ICD-10-CM | POA: Diagnosis not present

## 2021-02-24 DIAGNOSIS — Z789 Other specified health status: Secondary | ICD-10-CM | POA: Diagnosis not present

## 2021-02-24 DIAGNOSIS — F79 Unspecified intellectual disabilities: Secondary | ICD-10-CM | POA: Diagnosis not present

## 2021-04-07 DIAGNOSIS — I10 Essential (primary) hypertension: Secondary | ICD-10-CM | POA: Diagnosis not present

## 2021-04-07 DIAGNOSIS — E785 Hyperlipidemia, unspecified: Secondary | ICD-10-CM | POA: Diagnosis not present

## 2021-06-08 DIAGNOSIS — I10 Essential (primary) hypertension: Secondary | ICD-10-CM | POA: Diagnosis not present

## 2021-06-08 DIAGNOSIS — E785 Hyperlipidemia, unspecified: Secondary | ICD-10-CM | POA: Diagnosis not present

## 2021-06-23 DIAGNOSIS — F209 Schizophrenia, unspecified: Secondary | ICD-10-CM | POA: Diagnosis not present

## 2021-06-27 DIAGNOSIS — Z79899 Other long term (current) drug therapy: Secondary | ICD-10-CM | POA: Diagnosis not present

## 2021-08-18 DIAGNOSIS — Z23 Encounter for immunization: Secondary | ICD-10-CM | POA: Diagnosis not present

## 2021-08-19 DIAGNOSIS — U071 COVID-19: Secondary | ICD-10-CM | POA: Diagnosis not present

## 2021-09-27 DIAGNOSIS — F209 Schizophrenia, unspecified: Secondary | ICD-10-CM | POA: Diagnosis not present

## 2021-10-07 DIAGNOSIS — G4700 Insomnia, unspecified: Secondary | ICD-10-CM | POA: Diagnosis not present

## 2021-10-07 DIAGNOSIS — Z8709 Personal history of other diseases of the respiratory system: Secondary | ICD-10-CM | POA: Diagnosis not present

## 2021-10-07 DIAGNOSIS — L309 Dermatitis, unspecified: Secondary | ICD-10-CM | POA: Diagnosis not present

## 2021-11-08 DIAGNOSIS — L309 Dermatitis, unspecified: Secondary | ICD-10-CM | POA: Diagnosis not present

## 2021-11-08 DIAGNOSIS — G4700 Insomnia, unspecified: Secondary | ICD-10-CM | POA: Diagnosis not present

## 2021-11-08 DIAGNOSIS — Z8709 Personal history of other diseases of the respiratory system: Secondary | ICD-10-CM | POA: Diagnosis not present

## 2021-11-30 DIAGNOSIS — F209 Schizophrenia, unspecified: Secondary | ICD-10-CM | POA: Diagnosis not present

## 2022-01-12 DIAGNOSIS — F209 Schizophrenia, unspecified: Secondary | ICD-10-CM | POA: Diagnosis not present

## 2022-01-16 DIAGNOSIS — K529 Noninfective gastroenteritis and colitis, unspecified: Secondary | ICD-10-CM | POA: Diagnosis not present

## 2022-01-16 DIAGNOSIS — Z299 Encounter for prophylactic measures, unspecified: Secondary | ICD-10-CM | POA: Diagnosis not present

## 2022-01-16 DIAGNOSIS — Z713 Dietary counseling and surveillance: Secondary | ICD-10-CM | POA: Diagnosis not present

## 2022-01-16 DIAGNOSIS — Z789 Other specified health status: Secondary | ICD-10-CM | POA: Diagnosis not present

## 2022-01-16 DIAGNOSIS — Z6820 Body mass index (BMI) 20.0-20.9, adult: Secondary | ICD-10-CM | POA: Diagnosis not present

## 2022-01-26 DIAGNOSIS — Z79899 Other long term (current) drug therapy: Secondary | ICD-10-CM | POA: Diagnosis not present

## 2022-01-26 DIAGNOSIS — T148XXA Other injury of unspecified body region, initial encounter: Secondary | ICD-10-CM | POA: Diagnosis not present

## 2022-03-01 DIAGNOSIS — Z1339 Encounter for screening examination for other mental health and behavioral disorders: Secondary | ICD-10-CM | POA: Diagnosis not present

## 2022-03-01 DIAGNOSIS — Z7189 Other specified counseling: Secondary | ICD-10-CM | POA: Diagnosis not present

## 2022-03-01 DIAGNOSIS — Z299 Encounter for prophylactic measures, unspecified: Secondary | ICD-10-CM | POA: Diagnosis not present

## 2022-03-01 DIAGNOSIS — Z6820 Body mass index (BMI) 20.0-20.9, adult: Secondary | ICD-10-CM | POA: Diagnosis not present

## 2022-03-01 DIAGNOSIS — Z1331 Encounter for screening for depression: Secondary | ICD-10-CM | POA: Diagnosis not present

## 2022-03-01 DIAGNOSIS — R5383 Other fatigue: Secondary | ICD-10-CM | POA: Diagnosis not present

## 2022-03-01 DIAGNOSIS — Z125 Encounter for screening for malignant neoplasm of prostate: Secondary | ICD-10-CM | POA: Diagnosis not present

## 2022-03-01 DIAGNOSIS — Z0000 Encounter for general adult medical examination without abnormal findings: Secondary | ICD-10-CM | POA: Diagnosis not present

## 2022-03-01 DIAGNOSIS — Z79899 Other long term (current) drug therapy: Secondary | ICD-10-CM | POA: Diagnosis not present

## 2022-03-01 DIAGNOSIS — E7800 Pure hypercholesterolemia, unspecified: Secondary | ICD-10-CM | POA: Diagnosis not present

## 2022-03-01 DIAGNOSIS — Z789 Other specified health status: Secondary | ICD-10-CM | POA: Diagnosis not present

## 2022-09-28 DIAGNOSIS — Z6822 Body mass index (BMI) 22.0-22.9, adult: Secondary | ICD-10-CM | POA: Diagnosis not present

## 2022-09-28 DIAGNOSIS — Z20822 Contact with and (suspected) exposure to covid-19: Secondary | ICD-10-CM | POA: Diagnosis not present

## 2022-10-11 DIAGNOSIS — R5383 Other fatigue: Secondary | ICD-10-CM | POA: Diagnosis not present

## 2022-10-11 DIAGNOSIS — J069 Acute upper respiratory infection, unspecified: Secondary | ICD-10-CM | POA: Diagnosis not present

## 2022-10-11 DIAGNOSIS — Z299 Encounter for prophylactic measures, unspecified: Secondary | ICD-10-CM | POA: Diagnosis not present

## 2022-12-11 DIAGNOSIS — M79674 Pain in right toe(s): Secondary | ICD-10-CM | POA: Diagnosis not present

## 2022-12-11 DIAGNOSIS — M79675 Pain in left toe(s): Secondary | ICD-10-CM | POA: Diagnosis not present

## 2022-12-11 DIAGNOSIS — B351 Tinea unguium: Secondary | ICD-10-CM | POA: Diagnosis not present

## 2023-02-15 ENCOUNTER — Encounter (HOSPITAL_COMMUNITY): Payer: Self-pay | Admitting: Emergency Medicine

## 2023-02-15 ENCOUNTER — Emergency Department (HOSPITAL_COMMUNITY): Payer: 59

## 2023-02-15 ENCOUNTER — Other Ambulatory Visit: Payer: Self-pay

## 2023-02-15 ENCOUNTER — Emergency Department (HOSPITAL_COMMUNITY)
Admission: EM | Admit: 2023-02-15 | Discharge: 2023-02-15 | Disposition: A | Discharge status: Skilled Nursing Facility | Payer: 59 | Attending: Emergency Medicine | Admitting: Emergency Medicine

## 2023-02-15 DIAGNOSIS — Y92410 Unspecified street and highway as the place of occurrence of the external cause: Secondary | ICD-10-CM | POA: Insufficient documentation

## 2023-02-15 DIAGNOSIS — R519 Headache, unspecified: Secondary | ICD-10-CM | POA: Diagnosis not present

## 2023-02-15 DIAGNOSIS — S80811A Abrasion, right lower leg, initial encounter: Secondary | ICD-10-CM | POA: Insufficient documentation

## 2023-02-15 DIAGNOSIS — S81812A Laceration without foreign body, left lower leg, initial encounter: Secondary | ICD-10-CM | POA: Insufficient documentation

## 2023-02-15 DIAGNOSIS — R6889 Other general symptoms and signs: Secondary | ICD-10-CM | POA: Diagnosis not present

## 2023-02-15 DIAGNOSIS — M25512 Pain in left shoulder: Secondary | ICD-10-CM | POA: Insufficient documentation

## 2023-02-15 DIAGNOSIS — Z743 Need for continuous supervision: Secondary | ICD-10-CM | POA: Diagnosis not present

## 2023-02-15 DIAGNOSIS — M542 Cervicalgia: Secondary | ICD-10-CM | POA: Insufficient documentation

## 2023-02-15 DIAGNOSIS — M25562 Pain in left knee: Secondary | ICD-10-CM | POA: Diagnosis not present

## 2023-02-15 DIAGNOSIS — M79662 Pain in left lower leg: Secondary | ICD-10-CM | POA: Diagnosis not present

## 2023-02-15 DIAGNOSIS — S8992XA Unspecified injury of left lower leg, initial encounter: Secondary | ICD-10-CM | POA: Diagnosis present

## 2023-02-15 DIAGNOSIS — S0083XA Contusion of other part of head, initial encounter: Secondary | ICD-10-CM | POA: Insufficient documentation

## 2023-02-15 DIAGNOSIS — M25519 Pain in unspecified shoulder: Secondary | ICD-10-CM | POA: Diagnosis not present

## 2023-02-15 DIAGNOSIS — M47812 Spondylosis without myelopathy or radiculopathy, cervical region: Secondary | ICD-10-CM | POA: Diagnosis not present

## 2023-02-15 DIAGNOSIS — M25561 Pain in right knee: Secondary | ICD-10-CM | POA: Diagnosis not present
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
## 2023-02-15 NOTE — Discharge Instructions (Addendum)
The workup today overall reassuring.  No evidence of brain bleed or other fracture or dislocation.  Recommend topical antibiotic cream/ointment for areas where you have abrasions.  Recommend Tylenol/Motrin as needed for pain.  Please do not hesitate to return to emergency department for worrisome signs and symptoms we discussed become apparent.
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
## 2023-02-15 NOTE — ED Notes (Signed)
Pt returned from CT/XR 

## 2023-02-19 DIAGNOSIS — R030 Elevated blood-pressure reading, without diagnosis of hypertension: Secondary | ICD-10-CM | POA: Diagnosis not present

## 2023-02-19 DIAGNOSIS — Z6822 Body mass index (BMI) 22.0-22.9, adult: Secondary | ICD-10-CM | POA: Diagnosis not present

## 2023-02-19 DIAGNOSIS — T148XXA Other injury of unspecified body region, initial encounter: Secondary | ICD-10-CM | POA: Diagnosis not present

## 2023-03-08 DIAGNOSIS — I10 Essential (primary) hypertension: Secondary | ICD-10-CM | POA: Diagnosis not present

## 2023-03-08 DIAGNOSIS — Z0000 Encounter for general adult medical examination without abnormal findings: Secondary | ICD-10-CM | POA: Diagnosis not present

## 2023-03-08 DIAGNOSIS — D692 Other nonthrombocytopenic purpura: Secondary | ICD-10-CM | POA: Diagnosis not present

## 2023-03-08 DIAGNOSIS — D696 Thrombocytopenia, unspecified: Secondary | ICD-10-CM | POA: Diagnosis not present

## 2023-03-08 DIAGNOSIS — Z7189 Other specified counseling: Secondary | ICD-10-CM | POA: Diagnosis not present

## 2023-03-08 DIAGNOSIS — Z299 Encounter for prophylactic measures, unspecified: Secondary | ICD-10-CM | POA: Diagnosis not present

## 2023-03-12 DIAGNOSIS — M79675 Pain in left toe(s): Secondary | ICD-10-CM | POA: Diagnosis not present

## 2023-03-12 DIAGNOSIS — M79674 Pain in right toe(s): Secondary | ICD-10-CM | POA: Diagnosis not present

## 2023-03-12 DIAGNOSIS — B351 Tinea unguium: Secondary | ICD-10-CM | POA: Diagnosis not present

## 2023-05-16 DIAGNOSIS — R030 Elevated blood-pressure reading, without diagnosis of hypertension: Secondary | ICD-10-CM | POA: Diagnosis not present

## 2023-05-16 DIAGNOSIS — Z6821 Body mass index (BMI) 21.0-21.9, adult: Secondary | ICD-10-CM | POA: Diagnosis not present

## 2023-05-16 DIAGNOSIS — U071 COVID-19: Secondary | ICD-10-CM | POA: Diagnosis not present

## 2023-06-04 DIAGNOSIS — Z299 Encounter for prophylactic measures, unspecified: Secondary | ICD-10-CM | POA: Diagnosis not present

## 2023-06-04 DIAGNOSIS — R5383 Other fatigue: Secondary | ICD-10-CM | POA: Diagnosis not present

## 2023-06-04 DIAGNOSIS — Z0000 Encounter for general adult medical examination without abnormal findings: Secondary | ICD-10-CM | POA: Diagnosis not present

## 2023-06-04 DIAGNOSIS — E7800 Pure hypercholesterolemia, unspecified: Secondary | ICD-10-CM | POA: Diagnosis not present

## 2023-06-04 DIAGNOSIS — M79674 Pain in right toe(s): Secondary | ICD-10-CM | POA: Diagnosis not present

## 2023-06-04 DIAGNOSIS — Z79899 Other long term (current) drug therapy: Secondary | ICD-10-CM | POA: Diagnosis not present

## 2023-06-04 DIAGNOSIS — B351 Tinea unguium: Secondary | ICD-10-CM | POA: Diagnosis not present

## 2023-06-04 DIAGNOSIS — M79675 Pain in left toe(s): Secondary | ICD-10-CM | POA: Diagnosis not present

## 2023-08-13 DIAGNOSIS — M79674 Pain in right toe(s): Secondary | ICD-10-CM | POA: Diagnosis not present

## 2023-08-13 DIAGNOSIS — B351 Tinea unguium: Secondary | ICD-10-CM | POA: Diagnosis not present

## 2023-08-13 DIAGNOSIS — M79675 Pain in left toe(s): Secondary | ICD-10-CM | POA: Diagnosis not present

## 2023-10-12 DIAGNOSIS — D692 Other nonthrombocytopenic purpura: Secondary | ICD-10-CM | POA: Diagnosis not present

## 2023-10-12 DIAGNOSIS — K649 Unspecified hemorrhoids: Secondary | ICD-10-CM | POA: Diagnosis not present

## 2023-10-12 DIAGNOSIS — Z299 Encounter for prophylactic measures, unspecified: Secondary | ICD-10-CM | POA: Diagnosis not present

## 2023-10-23 ENCOUNTER — Encounter (INDEPENDENT_AMBULATORY_CARE_PROVIDER_SITE_OTHER): Payer: Self-pay | Admitting: *Deleted

## 2023-10-29 DIAGNOSIS — M79675 Pain in left toe(s): Secondary | ICD-10-CM | POA: Diagnosis not present

## 2023-10-29 DIAGNOSIS — M79674 Pain in right toe(s): Secondary | ICD-10-CM | POA: Diagnosis not present

## 2023-10-29 DIAGNOSIS — B351 Tinea unguium: Secondary | ICD-10-CM | POA: Diagnosis not present

## 2023-11-14 ENCOUNTER — Encounter (INDEPENDENT_AMBULATORY_CARE_PROVIDER_SITE_OTHER): Payer: Self-pay | Admitting: Gastroenterology

## 2023-11-14 ENCOUNTER — Ambulatory Visit (INDEPENDENT_AMBULATORY_CARE_PROVIDER_SITE_OTHER): Payer: MEDICAID | Admitting: Gastroenterology

## 2023-11-14 ENCOUNTER — Telehealth (INDEPENDENT_AMBULATORY_CARE_PROVIDER_SITE_OTHER): Payer: Self-pay | Admitting: Gastroenterology

## 2023-11-14 VITALS — BP 110/72 | HR 80 | Temp 97.8°F | Ht 67.0 in | Wt 128.5 lb

## 2023-11-14 DIAGNOSIS — D696 Thrombocytopenia, unspecified: Secondary | ICD-10-CM | POA: Diagnosis not present

## 2023-11-14 DIAGNOSIS — K5900 Constipation, unspecified: Secondary | ICD-10-CM | POA: Diagnosis not present

## 2023-11-14 DIAGNOSIS — K5904 Chronic idiopathic constipation: Secondary | ICD-10-CM | POA: Insufficient documentation

## 2023-11-14 DIAGNOSIS — K921 Melena: Secondary | ICD-10-CM | POA: Insufficient documentation

## 2023-11-14 MED ORDER — POLYETHYLENE GLYCOL 3350 17 G PO PACK: 17.0000 g | PACK | Freq: Two times a day (BID) | ORAL | 0 refills | Status: AC

## 2023-11-14 MED ORDER — PSYLLIUM 58.6 % PO PACK: 1.0000 | PACK | Freq: Two times a day (BID) | ORAL | 2 refills | Status: DC

## 2023-11-14 NOTE — H&P (View-Only) (Signed)
 Gerald Pierce , M.D. Gastroenterology & Hepatology [facility] Gastroenterology [address] [place], [place] [address] Primary Care Physician: Gerald Specking, MD [address] [place] [address]  Chief Complaint: Hematochezia and constipation  History of Present Illness: Gerald Pierce is a 63 y.o. male with schizoaffective disorder, hyperlipidemia who presents for evaluation of hematochezia and constipation.  Patient reports few months back he was straining when he saw fresh blood in his stool.  Since then patient PCP gave him " cream" to put per rectum and hematochezia has resolved  Patient reports easily bruising all over his body which is also evident on exam today  Reports occasional hard stool with straining and sense of incomplete evacuation The patient denies having any nausea, vomiting, fever, chills, hematochezia, hematemesis, abdominal distention, abdominal pain, diarrhea, jaundice, pruritus or weight loss.  Last UJW:JXBJ Last Colonoscopy:none  FHx: neg for any gastrointestinal/liver disease, no malignancies Social: neg smoking, alcohol or illicit drug use Surgical: Cholecystectomy  Labs from 05/2023 hemoglobin 15.2 platelet 90 normal liver enzymes  Past Medical History: Past Medical History:  Diagnosis Date   Mental retardation    Schizoaffective disorder (HCC)    Seizures (HCC)    hx 2011   Wears glasses     Past Surgical History: Past Surgical History:  Procedure Laterality Date   CHOLECYSTECTOMY     HARDWARE REMOVAL Right 05/13/2015   Procedure: HARDWARE REMOVAL RIGHT WRIST;  Surgeon: Gerald Loa, MD;  Location: [facility];  Service: Orthopedics;  Laterality: Right;   OPEN REDUCTION INTERNAL FIXATION (ORIF) DISTAL RADIAL FRACTURE Right 10/15/2014   Procedure: OPEN REDUCTION INTERNAL FIXATION (ORIF) RIGHT  DISTAL RADIAL FRACTURE;  Surgeon: Gerald Loa, MD;  Location:  SURGERY CENTER;  Service:  Orthopedics;  Laterality: Right;    Family History:No family history on file.  Social History: Social History   Tobacco Use  Smoking Status Never  Smokeless Tobacco Never   Social History   Substance and Sexual Activity  Alcohol Use No   Social History   Substance and Sexual Activity  Drug Use No    Allergies: No Known Allergies  Medications: Current Outpatient Medications  Medication Sig Dispense Refill   betamethasone valerate (VALISONE) 0.1 % cream Apply topically 2 (two) times daily.     brexpiprazole (REXULTI) 2 MG TABS tablet Take by mouth. One half tablet daily     ciclopirox (PENLAC) 8 % solution Apply topically at bedtime. Apply over nail and surrounding skin. Apply daily over previous coat. After seven (7) days, may remove with alcohol and continue cycle.     fexofenadine (ALLEGRA) 180 MG tablet Take 180 mg by mouth daily.     simvastatin (ZOCOR) 40 MG tablet Take 40 mg by mouth daily at 6 PM.      triamcinolone cream (KENALOG) 0.1 % Apply 1 Application topically 2 (two) times daily.     vitamin C (ASCORBIC ACID) 500 MG tablet Take 500 mg by mouth daily.     lurasidone (LATUDA) 40 MG TABS tablet Take 20 mg by mouth daily at 6 PM.  (Patient not taking: Reported on 11/14/2023)     No current facility-administered medications for this visit.    Review of Systems: GENERAL: negative for malaise, night sweats HEENT: No changes in hearing or vision, no nose bleeds or other nasal problems. NECK: Negative for lumps, goiter, pain and significant neck swelling RESPIRATORY: Negative for cough, wheezing CARDIOVASCULAR: Negative for chest pain, leg swelling, palpitations,  orthopnea GI: SEE HPI MUSCULOSKELETAL: Negative for joint pain or swelling, back pain, and muscle pain. SKIN: Negative for lesions, rash HEMATOLOGY Negative for prolonged bleeding, bruising easily, and swollen nodes. ENDOCRINE: Negative for cold or heat intolerance, polyuria, polydipsia and  goiter. NEURO: negative for tremor, gait imbalance, syncope and seizures. The remainder of the review of systems is noncontributory.   Physical Exam: BP 110/72   Pulse 80   Temp 97.8 F (36.6 C)   Ht 5\' 7"  (1.702 m)   Wt 128 lb 8 oz (58.3 kg)   BMI 20.13 kg/m  GENERAL: The patient is AO x3, in no acute distress. HEENT: Head is normocephalic and atraumatic. EOMI are intact. Mouth is well hydrated and without lesions. NECK: Supple. No masses LUNGS: Clear to auscultation. No presence of rhonchi/wheezing/rales. Adequate chest expansion HEART: RRR, normal s1 and s2. ABDOMEN: Soft, nontender, no guarding, no peritoneal signs, and nondistended. BS +. No masses.  Imaging/Labs: as above     Latest Ref Rng & Units 05/13/2015    7:53 AM 10/15/2014   10:32 AM 04/01/2010    3:40 AM  CBC  WBC 4.0 - 10.5 K/uL   6.6   Hemoglobin 13.0 - 17.0 g/dL 09.8  11.9  14.7   Hematocrit 39.0 - 52.0 %   43.2   Platelets 150 - 400 K/uL   93    Lab Results  Component Value Date   IRON 116 03/30/2010   TIBC 319 03/30/2010   FERRITIN 194 03/30/2010    I personally reviewed and interpreted the available labs, imaging and endoscopic files.  Impression and Plan: Gerald Pierce is a 63 y.o. male with schizoaffective disorder, hyperlipidemia who presents for evaluation of hematochezia and constipation.  #Painless hematochezia  This could be hemorrhoidal bleed but without any prior colonoscopy this is an alarm symptom and need to rule out polyp, inflammation less likely malignancy  Discussed with patient risks indication benefit and alternatives to colonoscopy for which he is agreeable  #Constipation   Patient has underlying constipation with sense of incomplete evacuation  Ensure adequate fluid intake: Aim for 8 glasses of water daily. Follow a high fiber diet: Include foods such as dates, prunes, pears, and kiwi. Take Miralax twice a day for the first week, then reduce to once daily  thereafter. Use Metamucil twice a day.  #Thrombocytopenia  On exam patient today has bruises on his extremities. Last CBC with thrombocytopenia platelet of 90,000.  Patient does report going to hematologist but is unsure of the recommendations  I will obtain complete abdominal ultrasound to assess for any cirrhosis or signs of portal hypertension with splenomegaly which may explain thrombocytopenia  Repeat CBC CMP and INR. Will refer the patient to hematology  All questions were answered.      Gerald Lawman, MD Gastroenterology and Hepatology [facility] Gastroenterology   This chart has been completed using [facility] Dictation software, and while attempts have been made to ensure accuracy , certain words and phrases may not be transcribed as intended

## 2023-11-14 NOTE — Progress Notes (Signed)
 Tynell Winchell Faizan Medardo Hassing , M.D. Gastroenterology & Hepatology [facility] Gastroenterology [address] [place], [place] [address] Primary Care Physician: Rosamond Leta NOVAK, MD [address] [place] [address]  Chief Complaint: Hematochezia and constipation  History of Present Illness: Gerald Pierce is a 63 y.o. male with schizoaffective disorder, hyperlipidemia who presents for evaluation of hematochezia and constipation.  Patient reports few months back he was straining when he saw fresh blood in his stool.  Since then patient PCP gave him  cream to put per rectum and hematochezia has resolved  Patient reports easily bruising all over his body which is also evident on exam today  Reports occasional hard stool with straining and sense of incomplete evacuation The patient denies having any nausea, vomiting, fever, chills, hematochezia, hematemesis, abdominal distention, abdominal pain, diarrhea, jaundice, pruritus or weight loss.  Last ZHI:wnwz Last Colonoscopy:none  FHx: neg for any gastrointestinal/liver disease, no malignancies Social: neg smoking, alcohol  or illicit drug use Surgical: Cholecystectomy  Labs from 05/2023 hemoglobin 15.2 platelet 90 normal liver enzymes  Past Medical History: Past Medical History:  Diagnosis Date   Mental retardation    Schizoaffective disorder (HCC)    Seizures (HCC)    hx 2011   Wears glasses     Past Surgical History: Past Surgical History:  Procedure Laterality Date   CHOLECYSTECTOMY     HARDWARE REMOVAL Right 05/13/2015   Procedure: HARDWARE REMOVAL RIGHT WRIST;  Surgeon: Franky Curia, MD;  Location: [facility];  Service: Orthopedics;  Laterality: Right;   OPEN REDUCTION INTERNAL FIXATION (ORIF) DISTAL RADIAL FRACTURE Right 10/15/2014   Procedure: OPEN REDUCTION INTERNAL FIXATION (ORIF) RIGHT  DISTAL RADIAL FRACTURE;  Surgeon: Franky Curia, MD;  Location: [facility];  Service:  Orthopedics;  Laterality: Right;    Family History:No family history on file.  Social History: Social History   Tobacco Use  Smoking Status Never  Smokeless Tobacco Never   Social History   Substance and Sexual Activity  Alcohol  Use No   Social History   Substance and Sexual Activity  Drug Use No    Allergies: No Known Allergies  Medications: Current Outpatient Medications  Medication Sig Dispense Refill   betamethasone valerate (VALISONE) 0.1 % cream Apply topically 2 (two) times daily.     brexpiprazole (REXULTI) 2 MG TABS tablet Take by mouth. One half tablet daily     ciclopirox (PENLAC) 8 % solution Apply topically at bedtime. Apply over nail and surrounding skin. Apply daily over previous coat. After seven (7) days, may remove with alcohol  and continue cycle.     fexofenadine (ALLEGRA) 180 MG tablet Take 180 mg by mouth daily.     simvastatin (ZOCOR) 40 MG tablet Take 40 mg by mouth daily at 6 PM.      triamcinolone  cream (KENALOG) 0.1 % Apply 1 Application topically 2 (two) times daily.     vitamin C (ASCORBIC ACID) 500 MG tablet Take 500 mg by mouth daily.     lurasidone (LATUDA) 40 MG TABS tablet Take 20 mg by mouth daily at 6 PM.  (Patient not taking: Reported on 11/14/2023)     No current facility-administered medications for this visit.    Review of Systems: GENERAL: negative for malaise, night sweats HEENT: No changes in hearing or vision, no nose bleeds or other nasal problems. NECK: Negative for lumps, goiter, pain and significant neck swelling RESPIRATORY: Negative for cough, wheezing CARDIOVASCULAR: Negative for chest pain, leg swelling, palpitations,  orthopnea GI: SEE HPI MUSCULOSKELETAL: Negative for joint pain or swelling, back pain, and muscle pain. SKIN: Negative for lesions, rash HEMATOLOGY Negative for prolonged bleeding, bruising easily, and swollen nodes. ENDOCRINE: Negative for cold or heat intolerance, polyuria, polydipsia and  goiter. NEURO: negative for tremor, gait imbalance, syncope and seizures. The remainder of the review of systems is noncontributory.   Physical Exam: BP 110/72   Pulse 80   Temp 97.8 F (36.6 C)   Ht 5' 7 (1.702 m)   Wt 128 lb 8 oz (58.3 kg)   BMI 20.13 kg/m  GENERAL: The patient is AO x3, in no acute distress. HEENT: Head is normocephalic and atraumatic. EOMI are intact. Mouth is well hydrated and without lesions. NECK: Supple. No masses LUNGS: Clear to auscultation. No presence of rhonchi/wheezing/rales. Adequate chest expansion HEART: RRR, normal s1 and s2. ABDOMEN: Soft, nontender, no guarding, no peritoneal signs, and nondistended. BS +. No masses.  Imaging/Labs: as above     Latest Ref Rng & Units 05/13/2015    7:53 AM 10/15/2014   10:32 AM 04/01/2010    3:40 AM  CBC  WBC 4.0 - 10.5 K/uL   6.6   Hemoglobin 13.0 - 17.0 g/dL 83.5  83.3  84.9   Hematocrit 39.0 - 52.0 %   43.2   Platelets 150 - 400 K/uL   93    Lab Results  Component Value Date   IRON 116 03/30/2010   TIBC 319 03/30/2010   FERRITIN 194 03/30/2010    I personally reviewed and interpreted the available labs, imaging and endoscopic files.  Impression and Plan: Gerald Pierce is a 63 y.o. male with schizoaffective disorder, hyperlipidemia who presents for evaluation of hematochezia and constipation.  #Painless hematochezia  This could be hemorrhoidal bleed but without any prior colonoscopy this is an alarm symptom and need to rule out polyp, inflammation less likely malignancy  Discussed with patient risks indication benefit and alternatives to colonoscopy for which he is agreeable  #Constipation   Patient has underlying constipation with sense of incomplete evacuation  Ensure adequate fluid intake: Aim for 8 glasses of water  daily. Follow a high fiber diet: Include foods such as dates, prunes, pears, and kiwi. Take Miralax  twice a day for the first week, then reduce to once daily  thereafter. Use Metamucil twice a day.  #Thrombocytopenia  On exam patient today has bruises on his extremities. Last CBC with thrombocytopenia platelet of 90,000.  Patient does report going to hematologist but is unsure of the recommendations  I will obtain complete abdominal ultrasound to assess for any cirrhosis or signs of portal hypertension with splenomegaly which may explain thrombocytopenia  Repeat CBC CMP and INR. Will refer the patient to hematology  All questions were answered.      Sofija Antwi Faizan Akyra Bouchie, MD Gastroenterology and Hepatology [facility] Gastroenterology   This chart has been completed using [facility] Dictation software, and while attempts have been made to ensure accuracy , certain words and phrases may not be transcribed as intended

## 2023-11-14 NOTE — Patient Instructions (Signed)
 It was very nice to meet you today, as dicussed with will plan for the following :  1) Blood work and Ultraosund  2) colonoscopy  3) Ensure adequate fluid intake: Aim for 8 glasses of water  daily. Follow a high fiber diet: Include foods such as dates, prunes, pears, and kiwi. Take Miralax  twice a day for the first week, then reduce to once daily thereafter. Use Metamucil twice a day.

## 2023-11-14 NOTE — Telephone Encounter (Signed)
 US  scheduled for 11/20/23 at 8:30am. [facility] to inform pt and to schedule TCS

## 2023-11-15 LAB — CBC WITH DIFFERENTIAL/PLATELET
Basophils Absolute: 0 10*3/uL (ref 0.0–0.2)
Basos: 0 %
EOS (ABSOLUTE): 0 10*3/uL (ref 0.0–0.4)
Eos: 0 %
Hematocrit: 44.5 % (ref 37.5–51.0)
Hemoglobin: 15.5 g/dL (ref 13.0–17.7)
Immature Grans (Abs): 0 10*3/uL (ref 0.0–0.1)
Immature Granulocytes: 0 %
Lymphocytes Absolute: 0.6 10*3/uL — ABNORMAL LOW (ref 0.7–3.1)
Lymphs: 12 %
MCH: 32.6 pg (ref 26.6–33.0)
MCHC: 34.8 g/dL (ref 31.5–35.7)
MCV: 94 fL (ref 79–97)
Monocytes Absolute: 0.6 10*3/uL (ref 0.1–0.9)
Monocytes: 11 %
Neutrophils Absolute: 4 10*3/uL (ref 1.4–7.0)
Neutrophils: 77 %
Platelets: 99 10*3/uL — CL (ref 150–450)
RBC: 4.76 x10E6/uL (ref 4.14–5.80)
RDW: 13 % (ref 11.6–15.4)
WBC: 5.2 10*3/uL (ref 3.4–10.8)

## 2023-11-15 LAB — COMPREHENSIVE METABOLIC PANEL
ALT: 38 [IU]/L (ref 0–44)
AST: 28 [IU]/L (ref 0–40)
Albumin: 4.4 g/dL (ref 3.8–4.9)
Alkaline Phosphatase: 75 [IU]/L (ref 44–121)
BUN/Creatinine Ratio: 13 (ref 10–24)
BUN: 11 mg/dL (ref 8–27)
Bilirubin Total: 0.7 mg/dL (ref 0.0–1.2)
CO2: 25 mmol/L (ref 20–29)
Calcium: 9.2 mg/dL (ref 8.6–10.2)
Chloride: 107 mmol/L — ABNORMAL HIGH (ref 96–106)
Creatinine, Ser: 0.84 mg/dL (ref 0.76–1.27)
Globulin, Total: 1.9 g/dL (ref 1.5–4.5)
Glucose: 78 mg/dL (ref 70–99)
Potassium: 4.3 mmol/L (ref 3.5–5.2)
Sodium: 144 mmol/L (ref 134–144)
Total Protein: 6.3 g/dL (ref 6.0–8.5)
eGFR: 100 mL/min/{1.73_m2} (ref >59)

## 2023-11-15 LAB — PROTIME-INR
INR: 1 (ref 0.9–1.2)
Prothrombin Time: 11.1 s (ref 9.1–12.0)

## 2023-11-15 NOTE — Progress Notes (Signed)
 Hi [name] can you please refer this patient to hematology for thrombocytopenia  ==============  Hi [name] ,  Can you please call the patient and tell the patient the lab work shows low platelet level . Please have ultrasound done as ordered and I will you will be referred to the blood doctors ( hematology)  Thanks,  Gerald Pierce Faizan Renn Dirocco, MD Gastroenterology and Hepatology [facility] Gastroenterology

## 2023-11-20 ENCOUNTER — Ambulatory Visit (HOSPITAL_COMMUNITY): Payer: 59

## 2023-11-20 MED ORDER — PEG 3350-KCL-NA BICARB-NACL 420 G PO SOLR: 4000.0000 mL | Freq: Once | ORAL | 0 refills | Status: DC

## 2023-11-20 NOTE — Addendum Note (Signed)
Addended by: Marlowe Shores on: 11/20/2023 02:42 PM   Modules accepted: Orders

## 2023-11-20 NOTE — Telephone Encounter (Signed)
[facility] contacted. Pt scheduled for 12/10/23. Instructions mailed. Prep sent to pharmacy. Pt has Medicare insurance so no pa needed

## 2023-11-21 ENCOUNTER — Inpatient Hospital Stay: Payer: 59 | Attending: Oncology | Admitting: Oncology

## 2023-11-21 ENCOUNTER — Inpatient Hospital Stay: Payer: 59

## 2023-11-21 VITALS — BP 131/80 | HR 63 | Temp 98.0°F | Resp 18 | Ht 67.0 in | Wt 127.1 lb

## 2023-11-21 DIAGNOSIS — E785 Hyperlipidemia, unspecified: Secondary | ICD-10-CM | POA: Diagnosis not present

## 2023-11-21 DIAGNOSIS — K766 Portal hypertension: Secondary | ICD-10-CM | POA: Diagnosis not present

## 2023-11-21 DIAGNOSIS — K921 Melena: Secondary | ICD-10-CM | POA: Insufficient documentation

## 2023-11-21 DIAGNOSIS — D696 Thrombocytopenia, unspecified: Secondary | ICD-10-CM

## 2023-11-21 DIAGNOSIS — F259 Schizoaffective disorder, unspecified: Secondary | ICD-10-CM | POA: Insufficient documentation

## 2023-11-21 DIAGNOSIS — E538 Deficiency of other specified B group vitamins: Secondary | ICD-10-CM | POA: Insufficient documentation

## 2023-11-21 DIAGNOSIS — Z9049 Acquired absence of other specified parts of digestive tract: Secondary | ICD-10-CM | POA: Diagnosis not present

## 2023-11-21 DIAGNOSIS — Z79899 Other long term (current) drug therapy: Secondary | ICD-10-CM | POA: Diagnosis not present

## 2023-11-21 LAB — CBC WITH DIFFERENTIAL/PLATELET
Abs Immature Granulocytes: 0.02 10*3/uL (ref 0.00–0.07)
Basophils Absolute: 0 10*3/uL (ref 0.0–0.1)
Basophils Relative: 0 %
Eosinophils Absolute: 0 10*3/uL (ref 0.0–0.5)
Eosinophils Relative: 1 %
HCT: 46.2 % (ref 39.0–52.0)
Hemoglobin: 16.1 g/dL (ref 13.0–17.0)
Immature Granulocytes: 0 %
Lymphocytes Relative: 11 %
Lymphs Abs: 0.6 10*3/uL — ABNORMAL LOW (ref 0.7–4.0)
MCH: 32.1 pg (ref 26.0–34.0)
MCHC: 34.8 g/dL (ref 30.0–36.0)
MCV: 92.2 fL (ref 80.0–100.0)
Monocytes Absolute: 0.6 10*3/uL (ref 0.1–1.0)
Monocytes Relative: 10 %
Neutro Abs: 4.4 10*3/uL (ref 1.7–7.7)
Neutrophils Relative %: 78 %
Platelets: 106 10*3/uL — ABNORMAL LOW (ref 150–400)
RBC: 5.01 MIL/uL (ref 4.22–5.81)
RDW: 13.8 % (ref 11.5–15.5)
WBC: 5.6 10*3/uL (ref 4.0–10.5)
nRBC: 0 % (ref 0.0–0.2)

## 2023-11-21 LAB — COMPREHENSIVE METABOLIC PANEL
ALT: 36 U/L (ref 0–44)
AST: 27 U/L (ref 15–41)
Albumin: 4.4 g/dL (ref 3.5–5.0)
Alkaline Phosphatase: 60 U/L (ref 38–126)
Anion gap: 9 (ref 5–15)
BUN: 13 mg/dL (ref 6–20)
CO2: 26 mmol/L (ref 22–32)
Calcium: 9.5 mg/dL (ref 8.9–10.3)
Chloride: 106 mmol/L (ref 98–111)
Creatinine, Ser: 0.7 mg/dL (ref 0.61–1.24)
GFR, Estimated: >60 mL/min (ref >60)
Glucose, Bld: 86 mg/dL (ref 70–99)
Potassium: 4.1 mmol/L (ref 3.5–5.1)
Sodium: 141 mmol/L (ref 135–145)
Total Bilirubin: 1.2 mg/dL (ref 0.0–1.2)
Total Protein: 7.2 g/dL (ref 6.5–8.1)

## 2023-11-21 LAB — HEPATITIS PANEL, ACUTE
HCV Ab: NONREACTIVE
Hep A IgM: NONREACTIVE
Hep B C IgM: NONREACTIVE
Hepatitis B Surface Ag: NONREACTIVE

## 2023-11-21 LAB — IRON AND TIBC
Iron: 65 ug/dL (ref 45–182)
Saturation Ratios: 20 % (ref 17.9–39.5)
TIBC: 329 ug/dL (ref 250–450)
UIBC: 264 ug/dL

## 2023-11-21 LAB — RETICULOCYTES
Immature Retic Fract: 8.1 % (ref 2.3–15.9)
RBC.: 4.97 MIL/uL (ref 4.22–5.81)
Retic Count, Absolute: 62.1 10*3/uL (ref 19.0–186.0)
Retic Ct Pct: 1.3 % (ref 0.4–3.1)

## 2023-11-21 LAB — FERRITIN: Ferritin: 129 ng/mL (ref 24–336)

## 2023-11-21 LAB — VITAMIN B12: Vitamin B-12: 246 pg/mL (ref 180–914)

## 2023-11-21 LAB — FOLATE: Folate: 27.3 ng/mL (ref >5.9)

## 2023-11-21 LAB — HIV ANTIBODY (ROUTINE TESTING W REFLEX): HIV Screen 4th Generation wRfx: NONREACTIVE

## 2023-11-21 NOTE — Patient Instructions (Signed)
VISIT SUMMARY:  You came in today for a follow-up visit regarding your low platelet levels. We discussed your history of rectal bleeding, increased bruising, and recent lab results.  YOUR PLAN:  -THROMBOCYTOPENIA: Thrombocytopenia means having a low platelet count, which can lead to easy bruising and bleeding. We will order comprehensive blood work to check for potential causes such as nutritional deficiencies, viral infections, and medication side effects. We will review the results in 2 weeks.  -GENERAL HEALTH MAINTENANCE: Continue your current care in the group home setting as it seems to be working well for you.  INSTRUCTIONS:  Please come back in 2 weeks to review your blood work results.

## 2023-11-21 NOTE — Assessment & Plan Note (Signed)
Patient reported a few episodes of hematochezia.  Resolved at this time.  Being evaluated by GI.

## 2023-11-21 NOTE — Progress Notes (Signed)
[facility] HEMATOLOGY NEW VISIT  Ignatius Specking, MD  REASON FOR REFERRAL: Thrombocytopenia  SUMMARY OF HEMATOLOGIC HISTORY: -Known thrombocytopenia since 2011. -As per the patient was evaluated by hematologist in [place] but unsure of details.   HISTORY OF PRESENT ILLNESS: Gerald Pierce 63 y.o. male referred for thrombocytopenia.  He is accompanied by his In Charge from group home.  He has a past medical history of schizoaffective disorder and hyperlipidemia.He reports a history of rectal bleeding, but denies any current episodes. He has been aware of his low platelet count for several years, and has previously seen a hematologist in [place], who reportedly told him there was nothing he could do about it. He denies any known iron deficiency.  The patient also reports increased bruising over the past two years, which he attributes to his low platelet count. The bruising occurs when he hits or bumps into something, and he denies any spontaneous bruising. He denies any other bleeding, weight loss, or pain. His appetite is reportedly good.  He denies any new start medications recently.  Denies recent illness.  He is a non-smoker, nonalcoholic.  Lives in a group home in [place].  I have reviewed the past medical history, past surgical history, social history and family history with the patient   ALLERGIES:  has no known allergies.  MEDICATIONS:  Current Outpatient Medications  Medication Sig Dispense Refill   betamethasone valerate (VALISONE) 0.1 % cream Apply topically 2 (two) times daily.     bisacodyl (DULCOLAX) 5 MG EC tablet Take by mouth.     brexpiprazole (REXULTI) 2 MG TABS tablet Take by mouth. One half tablet daily     ciclopirox (PENLAC) 8 % solution Apply topically at bedtime. Apply over nail and surrounding skin. Apply daily over previous coat. After seven (7) days, may remove with alcohol and continue cycle.     fexofenadine  (ALLEGRA) 180 MG tablet Take 180 mg by mouth daily.     FLUBLOK 0.5 ML SOSY      lurasidone (LATUDA) 40 MG TABS tablet Take 20 mg by mouth daily at 6 PM.     polyethylene glycol (MIRALAX / GLYCOLAX) 17 g packet Take 17 g by mouth 2 (two) times daily. 180 packet 0   psyllium (METAMUCIL) 58.6 % packet Take 1 packet by mouth 2 (two) times daily. 60 packet 2   simvastatin (ZOCOR) 40 MG tablet Take 40 mg by mouth daily at 6 PM.      SPIKEVAX syringe      triamcinolone cream (KENALOG) 0.1 % Apply 1 Application topically 2 (two) times daily.     vitamin C (ASCORBIC ACID) 500 MG tablet Take 500 mg by mouth daily.     No current facility-administered medications for this visit.     REVIEW OF SYSTEMS:   Constitutional: Denies fevers, chills or night sweats Eyes: Denies blurriness of vision Ears, nose, mouth, throat, and face: Denies mucositis or sore throat Respiratory: Denies cough, dyspnea or wheezes Cardiovascular: Denies palpitation, chest discomfort or lower extremity swelling Gastrointestinal:  Denies nausea, heartburn or change in bowel habits Lymphatics: Denies new lymphadenopathy or easy bruising Neurological:Denies numbness, tingling or new weaknesses Behavioral/Psych: Mood is stable, no new changes  All other systems were reviewed with the patient and are negative.  PHYSICAL EXAMINATION:   Vitals:   11/21/23 1252  BP: 131/80  Pulse: 63  Resp: 18  Temp: 98 F (36.7 C)  SpO2: 96%  GENERAL:alert, no distress and comfortable SKIN: Small bruises on his B/L hands.  Skin color, texture, turgor are normal, no rashes EYES: normal, Conjunctiva are pink and non-injected, sclera clear OROPHARYNX:no exudate, no erythema and lips, buccal mucosa, and tongue normal, no petechiae. NECK: supple, thyroid normal size, non-tender, without nodularity LYMPH:  no palpable lymphadenopathy in the cervical, axillary or inguinal LUNGS: clear to auscultation and percussion with normal breathing  effort HEART: regular rate & rhythm and no murmurs and no lower extremity edema ABDOMEN:abdomen soft, non-tender and normal bowel sounds Musculoskeletal:no cyanosis of digits and no clubbing  NEURO: alert & oriented x 3 with fluent speech  LABORATORY DATA:  I have reviewed the data as listed  Latest Reference Range & Units 11/14/23 15:05  COMPREHENSIVE METABOLIC PANEL  Rpt !  Sodium 135 - 145 mmol/L 144  Potassium 3.5 - 5.1 mmol/L 4.3  Chloride 98 - 111 mmol/L 107 (H)  CO2 22 - 32 mmol/L 25  Glucose 70 - 99 mg/dL 78  Mean Plasma Glucose <117 mg/dL   BUN 6 - 20 mg/dL 11  Creatinine 9.56 - 2.13 mg/dL 0.86  Calcium 8.9 - 57.8 mg/dL 9.2  Anion gap 5 - 15    BUN/Creatinine Ratio 10 - 24  13  eGFR >59 mL/min/1.73 100  Calcium Ionized 1.12 - 1.32 mmol/L   Alkaline Phosphatase 38 - 126 U/L 75  Albumin 3.5 - 5.0 g/dL 4.4  AST 15 - 41 U/L 28  ALT 0 - 44 U/L 38  Total Protein 6.5 - 8.1 g/dL 6.3  Ammonia 11 - 35 umol/L   Total Bilirubin 0.0 - 1.2 mg/dL 0.7  GFR, Estimated >46 mL/min   !: Data is abnormal (H): Data is abnormally high (L): Data is abnormally low Rpt: View report in Results Review for more information   Latest Reference Range & Units 11/14/23 15:05  WBC 4.0 - 10.5 K/uL 5.2  RBC 4.22 - 5.81 MIL/uL 4.76  Hemoglobin 13.0 - 17.0 g/dL 96.2  HCT 95.2 - 84.1 % 44.5  MCV 80.0 - 100.0 fL 94  MCH 26.0 - 34.0 pg 32.6  MCHC 30.0 - 36.0 g/dL 32.4  RDW 40.1 - 02.7 % 13.0  Platelets 150 - 400 K/uL 99 (LL)  nRBC 0.0 - 0.2 %   Hematology Comments:  Note:  Neutrophils % 77  Lymphocytes %   Monocytes Relative %   Eosinophil %   Basophil %   Immature Granulocytes % 0  NEUT# 1.7 - 7.7 K/uL 4.0  Lymphs Abs 0.7 - 4.0 K/uL 0.6 (L)  Monocyte # 0.1 - 1.0 K/uL   Monocytes Absolute 0.1 - 0.9 x10E3/uL 0.6  Eosinophils Absolute 0.0 - 0.5 K/uL   Basophils Absolute 0.0 - 0.1 K/uL 0.0  Abs Immature Granulocytes 0.00 - 0.07 K/uL   Immature Grans (Abs) 0.0 - 0.1 x10E3/uL 0.0  Lymphs Not  Estab. % 12  Monocytes Not Estab. % 11  Basos Not Estab. % 0  (LL): Data is critically low (L): Data is abnormally low (H): Data is abnormally high   Latest Reference Range & Units 03/30/10 14:25 03/30/10 15:33 03/30/10 23:54 04/01/10 03:40 11/14/23 15:05 11/21/23 13:31  Platelets 150 - 400 K/uL 93 (L) 92 (L) 101 (L) 93 (L) 99 (LL) 106 (L)  (LL): Data is critically low (L): Data is abnormally low   ASSESSMENT & PLAN:  Patient is a 63 year old male referred for thrombocytopenia   Thrombocytopenia (HCC) Chronic low platelets since 2011.No active bleeding at this time. Small  bruising noted.  Differential diagnosis include nutritional deficiencies, medication side effects, viral infections or chronic ITP. Normal liver enzymes noted.  No splenomegaly on examination.  -Order comprehensive blood work to evaluate for potential causes including nutritional deficiencies, viral infections, and medication side effects. -Review results in 2 weeks.  Hematochezia Patient reported a few episodes of hematochezia.  Resolved at this time.  Being evaluated by GI.   Orders Placed This Encounter  Procedures   CBC with Differential/Platelet    Standing Status:   Future    Number of Occurrences:   1    Expected Date:   11/21/2023    Expiration Date:   11/20/2024   Comprehensive metabolic panel    Standing Status:   Future    Number of Occurrences:   1    Expected Date:   11/21/2023    Expiration Date:   11/20/2024   Folate    Standing Status:   Future    Number of Occurrences:   1    Expected Date:   11/21/2023    Expiration Date:   11/20/2024   Ferritin    Standing Status:   Future    Number of Occurrences:   1    Expected Date:   11/21/2023    Expiration Date:   11/20/2024   Vitamin B12    Standing Status:   Future    Number of Occurrences:   1    Expected Date:   11/21/2023    Expiration Date:   11/20/2024   Reticulocytes    Standing Status:   Future    Number of Occurrences:   1     Expected Date:   11/21/2023    Expiration Date:   11/20/2024   Iron and TIBC    Standing Status:   Future    Number of Occurrences:   1    Expected Date:   11/21/2023    Expiration Date:   11/20/2024   HIV antibody (with reflex)    Standing Status:   Future    Number of Occurrences:   1    Expected Date:   11/21/2023    Expiration Date:   11/20/2024   Hepatitis panel, acute    Standing Status:   Future    Number of Occurrences:   1    Expected Date:   11/21/2023    Expiration Date:   11/20/2024    The total time spent in the appointment was 45 minutes encounter with patients including review of chart and various tests results, discussions about plan of care and coordination of care plan   All questions were answered. The patient knows to call the clinic with any problems, questions or concerns. No barriers to learning was detected.   Cindie Crumbly, MD 2/12/20254:16 PM

## 2023-11-21 NOTE — Assessment & Plan Note (Signed)
Chronic low platelets since 2011.No active bleeding at this time. Small bruising noted.  Differential diagnosis include nutritional deficiencies, medication side effects, viral infections or chronic ITP. Normal liver enzymes noted.  No splenomegaly on examination.  -Order comprehensive blood work to evaluate for potential causes including nutritional deficiencies, viral infections, and medication side effects. -Review results in 2 weeks.

## 2023-11-22 ENCOUNTER — Ambulatory Visit (HOSPITAL_COMMUNITY)
Admission: RE | Admit: 2023-11-22 | Discharge: 2023-11-22 | Disposition: A | Discharge status: Home / Self Care | Payer: 59 | Source: Ambulatory Visit | Attending: Gastroenterology | Admitting: Gastroenterology

## 2023-11-22 DIAGNOSIS — K921 Melena: Secondary | ICD-10-CM | POA: Diagnosis not present

## 2023-11-22 DIAGNOSIS — D696 Thrombocytopenia, unspecified: Secondary | ICD-10-CM | POA: Insufficient documentation

## 2023-11-22 DIAGNOSIS — K766 Portal hypertension: Secondary | ICD-10-CM | POA: Diagnosis not present

## 2023-11-22 DIAGNOSIS — Z9049 Acquired absence of other specified parts of digestive tract: Secondary | ICD-10-CM | POA: Diagnosis not present

## 2023-11-26 ENCOUNTER — Telehealth: Payer: Self-pay | Admitting: *Deleted

## 2023-11-26 ENCOUNTER — Other Ambulatory Visit (INDEPENDENT_AMBULATORY_CARE_PROVIDER_SITE_OTHER): Payer: Self-pay

## 2023-11-26 MED ORDER — PEG 3350-KCL-NA BICARB-NACL 420 G PO SOLR: 4000.0000 mL | Freq: Once | ORAL | 0 refills | Status: AC

## 2023-11-26 NOTE — Telephone Encounter (Signed)
 Prep sent to pharmacy

## 2023-11-26 NOTE — Telephone Encounter (Signed)
 Called Gerald [facility] at [facility] to give results of ultrasound and she asked for the prep for procedure be sent to [facility] pharmacy.   [phone]

## 2023-11-26 NOTE — Progress Notes (Signed)
 Hi Wendy ,  Can you please call the patient and tell the patient the ultrasound was normal and did not suggest any cirrhosis .  Thanks,  [name], MD Gastroenterology and Hepatology [facility] Gastroenterology

## 2023-12-05 ENCOUNTER — Inpatient Hospital Stay (HOSPITAL_BASED_OUTPATIENT_CLINIC_OR_DEPARTMENT_OTHER): Payer: 59 | Admitting: Oncology

## 2023-12-05 VITALS — BP 107/71 | HR 70 | Temp 96.7°F | Resp 18 | Wt 127.4 lb

## 2023-12-05 DIAGNOSIS — D696 Thrombocytopenia, unspecified: Secondary | ICD-10-CM | POA: Diagnosis not present

## 2023-12-05 DIAGNOSIS — E785 Hyperlipidemia, unspecified: Secondary | ICD-10-CM | POA: Diagnosis not present

## 2023-12-05 DIAGNOSIS — E538 Deficiency of other specified B group vitamins: Secondary | ICD-10-CM | POA: Diagnosis not present

## 2023-12-05 DIAGNOSIS — K766 Portal hypertension: Secondary | ICD-10-CM | POA: Diagnosis not present

## 2023-12-05 DIAGNOSIS — Z9049 Acquired absence of other specified parts of digestive tract: Secondary | ICD-10-CM | POA: Diagnosis not present

## 2023-12-05 DIAGNOSIS — K921 Melena: Secondary | ICD-10-CM | POA: Diagnosis not present

## 2023-12-05 DIAGNOSIS — Z79899 Other long term (current) drug therapy: Secondary | ICD-10-CM | POA: Diagnosis not present

## 2023-12-05 MED ORDER — VITAMIN B-12 1000 MCG PO TABS: 1000.0000 ug | ORAL_TABLET | Freq: Every day | ORAL | 2 refills | Status: AC

## 2023-12-05 NOTE — Patient Instructions (Signed)
 VISIT SUMMARY:  You came in today for a follow-up visit regarding your chronic low platelet count and newly discovered vitamin B12 deficiency. You reported no bleeding but did mention easy bruising. Overall, you are doing well without any significant symptoms.  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 Your platelet count is stable around 100, and there is no bleeding, although you do have some bruising. We will continue to monitor your platelet count every six months. If it remains stable for a couple of years, we will transfer your follow-up care to your primary care doctor.  -VITAMIN B12 DEFICIENCY: Vitamin B12 deficiency means you have lower than normal levels of vitamin B12, which is important for nerve function and the production of red blood cells. You will start taking Vitamin B12 supplements daily. We will repeat your blood tests in three months to check if your levels have improved. If they have not, we may consider giving you Vitamin B12 shots.  INSTRUCTIONS:  Please follow up in six months and have your labs done before the visit. Your prescription has been sent.

## 2023-12-05 NOTE — Assessment & Plan Note (Signed)
 Patient has chronic thrombocytopenia since 2011.  Does not report bleeding or petechiae.  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  Recent ultrasound showed no splenomegaly or hepatomegaly.  Hepatitis and HIV panel negative. -Continue to monitor counts at this time.  Return to clinic in 6 months with labs.

## 2023-12-05 NOTE — Assessment & Plan Note (Signed)
 Patient has mild vitamin B12 deficiency. -Start taking cyanocobalamin 1000 mcg daily

## 2023-12-05 NOTE — Progress Notes (Signed)
 [facility] HEMATOLOGY FOLLOW-UP VISIT  Gerald Specking, MD  REASON FOR FOLLOW-UP: Chronic thrombocytopenia  ASSESSMENT & PLAN:  Patient is a 63 year old male following for chronic thrombocytopenia.   Thrombocytopenia (HCC) Patient has chronic thrombocytopenia since 2011.  Does not report bleeding or petechiae.  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  Recent ultrasound showed no splenomegaly or hepatomegaly.  Hepatitis and HIV panel negative. -Continue to monitor counts at this time.  Return to clinic in 6 months with labs.  Vitamin B12 deficiency Patient has mild vitamin B12 deficiency. -Start taking cyanocobalamin 1000 mcg daily   Orders Placed This Encounter  Procedures   Comprehensive metabolic panel    Standing Status:   Future    Expected Date:   06/09/2024    Expiration Date:   12/04/2024   CBC with Differential/Platelet    Standing Status:   Future    Expected Date:   06/09/2024    Expiration Date:   12/04/2024   Vitamin B12    Standing Status:   Future    Expected Date:   06/09/2024    Expiration Date:   12/04/2024    The total time spent in the appointment was 20 minutes encounter with patients including review of chart and various tests results, discussions about plan of care and coordination of care plan   All questions were answered. The patient knows to call the clinic with any problems, questions or concerns. No barriers to learning was detected.  Cindie Crumbly, MD 2/26/20255:05 PM    INTERVAL HISTORY: Gerald Pierce 63 y.o. male following for thrombocytopenia.  He is accompanied by his In Charge from group home. He reports no bleeding, but does note easy bruising.  He denies bright red blood per rectum, melena, weight loss, loss of appetite.  Overall he is doing well.   I have reviewed the past medical history, past surgical history, social history and family history with the patient   ALLERGIES:  has no  known allergies.  MEDICATIONS:  Current Outpatient Medications  Medication Sig Dispense Refill   betamethasone valerate (VALISONE) 0.1 % cream Apply topically 2 (two) times daily.     bisacodyl (DULCOLAX) 5 MG EC tablet Take by mouth.     brexpiprazole (REXULTI) 2 MG TABS tablet Take by mouth. One half tablet daily     ciclopirox (PENLAC) 8 % solution Apply topically at bedtime. Apply over nail and surrounding skin. Apply daily over previous coat. After seven (7) days, may remove with alcohol and continue cycle.     cyanocobalamin (VITAMIN B12) 1000 MCG tablet Take 1 tablet (1,000 mcg total) by mouth daily. 90 tablet 2   fexofenadine (ALLEGRA) 180 MG tablet Take 180 mg by mouth daily.     FLUBLOK 0.5 ML SOSY      fluticasone (FLONASE) 50 MCG/ACT nasal spray 1 spray Once Daily.     lurasidone (LATUDA) 40 MG TABS tablet Take 20 mg by mouth daily at 6 PM.     polyethylene glycol (MIRALAX / GLYCOLAX) 17 g packet Take 17 g by mouth 2 (two) times daily. 180 packet 0   psyllium (METAMUCIL) 58.6 % packet Take 1 packet by mouth 2 (two) times daily. 60 packet 2   simvastatin (ZOCOR) 40 MG tablet Take 40 mg by mouth daily at 6 PM.      SPIKEVAX syringe      triamcinolone cream (KENALOG) 0.1 % Apply 1 Application topically 2 (two) times  daily.     vitamin C (ASCORBIC ACID) 500 MG tablet Take 500 mg by mouth daily.     No current facility-administered medications for this visit.     REVIEW OF SYSTEMS:   Constitutional: Denies fevers, chills or night sweats Eyes: Denies blurriness of vision Ears, nose, mouth, throat, and face: Denies mucositis or sore throat Respiratory: Denies cough, dyspnea or wheezes Cardiovascular: Denies palpitation, chest discomfort or lower extremity swelling Gastrointestinal:  Denies nausea, heartburn or change in bowel habits Skin: Denies abnormal skin rashes Lymphatics: Denies new lymphadenopathy or easy bruising Neurological:Denies numbness, tingling or new  weaknesses Behavioral/Psych: Mood is stable, no new changes  All other systems were reviewed with the patient and are negative.  PHYSICAL EXAMINATION:   Vitals:   12/05/23 0848  BP: 107/71  Pulse: 70  Resp: 18  Temp: (!) 96.7 F (35.9 C)  SpO2: 97%    GENERAL:alert, no distress and comfortable LUNGS: clear to auscultation and percussion with normal breathing effort HEART: regular rate & rhythm and no murmurs and no lower extremity edema ABDOMEN:abdomen soft, non-tender and normal bowel sounds Musculoskeletal:no cyanosis of digits and no clubbing  NEURO: alert & oriented x 3 with fluent speech  LABORATORY DATA:  I have reviewed the data as listed  Lab Results  Component Value Date   WBC 5.6 11/21/2023   NEUTROABS 4.4 11/21/2023   HGB 16.1 11/21/2023   HCT 46.2 11/21/2023   MCV 92.2 11/21/2023   PLT 106 (L) 11/21/2023      Component Value Date/Time   NA 141 11/21/2023 1331   NA 144 11/14/2023 1505   K 4.1 11/21/2023 1331   CL 106 11/21/2023 1331   CO2 26 11/21/2023 1331   GLUCOSE 86 11/21/2023 1331   BUN 13 11/21/2023 1331   BUN 11 11/14/2023 1505   CREATININE 0.70 11/21/2023 1331   CALCIUM 9.5 11/21/2023 1331   PROT 7.2 11/21/2023 1331   PROT 6.3 11/14/2023 1505   ALBUMIN 4.4 11/21/2023 1331   ALBUMIN 4.4 11/14/2023 1505   AST 27 11/21/2023 1331   ALT 36 11/21/2023 1331   ALKPHOS 60 11/21/2023 1331   BILITOT 1.2 11/21/2023 1331   BILITOT 0.7 11/14/2023 1505   GFRNONAA >60 11/21/2023 1331   GFRAA  04/01/2010 0340    >60        The eGFR has been calculated using the MDRD equation. This calculation has not been validated in all clinical situations. eGFR's persistently <60 mL/min signify possible Chronic Kidney Disease.      Chemistry      Component Value Date/Time   NA 141 11/21/2023 1331   NA 144 11/14/2023 1505   K 4.1 11/21/2023 1331   CL 106 11/21/2023 1331   CO2 26 11/21/2023 1331   BUN 13 11/21/2023 1331   BUN 11 11/14/2023 1505    CREATININE 0.70 11/21/2023 1331      Component Value Date/Time   CALCIUM 9.5 11/21/2023 1331   ALKPHOS 60 11/21/2023 1331   AST 27 11/21/2023 1331   ALT 36 11/21/2023 1331   BILITOT 1.2 11/21/2023 1331   BILITOT 0.7 11/14/2023 1505      Latest Reference Range & Units 11/21/23 13:31  Hep A Ab, IgM NON REACTIVE  NON REACTIVE  Hepatitis B Surface Ag NON REACTIVE  NON REACTIVE  Hep B Core Ab, IgM NON REACTIVE  NON REACTIVE  HCV Ab NON REACTIVE  NON REACTIVE  HIV Screen 4th Generation wRfx Non Reactive  Non Reactive  Latest Reference Range & Units 11/21/23 13:31  Iron 45 - 182 ug/dL 65  UIBC ug/dL 829  TIBC 562 - 130 ug/dL 865  Saturation Ratios 17.9 - 39.5 % 20  Ferritin 24 - 336 ng/mL 129  Vitamin B12 180 - 914 pg/mL 246    RADIOGRAPHIC STUDIES: I have personally reviewed the radiological images as listed and agreed with the findings in the report.  US Abdomen Complete CLINICAL DATA:  Thrombocytopenia.  Portal hypertension.  EXAM: ABDOMEN ULTRASOUND COMPLETE  COMPARISON:  CT abdomen pelvis 03/31/2010  FINDINGS: Gallbladder: Surgically absent  Common bile duct: Diameter: 3.9 mm  Liver: No focal lesion identified. Within normal limits in parenchymal echogenicity. Portal vein is patent on color Doppler imaging with normal direction of blood flow towards the liver.  IVC: No abnormality visualized.  Pancreas: Visualized portion unremarkable.  Spleen: Size and appearance within normal limits.  Right Kidney: Length: 11.7 cm. Echogenicity within normal limits. No mass or hydronephrosis visualized.  Left Kidney: Length: 11.6 cm. Echogenicity within normal limits. No mass or hydronephrosis visualized.  Abdominal aorta: No aneurysm visualized.  Other findings: None.  IMPRESSION: 1. No acute process. 2. Status post cholecystectomy.  Electronically Signed   By: Annia Belt M.D.   On: 11/22/2023 11:23

## 2023-12-06 DIAGNOSIS — E7800 Pure hypercholesterolemia, unspecified: Secondary | ICD-10-CM | POA: Diagnosis not present

## 2023-12-06 DIAGNOSIS — D696 Thrombocytopenia, unspecified: Secondary | ICD-10-CM | POA: Diagnosis not present

## 2023-12-06 DIAGNOSIS — Z299 Encounter for prophylactic measures, unspecified: Secondary | ICD-10-CM | POA: Diagnosis not present

## 2023-12-10 ENCOUNTER — Other Ambulatory Visit: Payer: Self-pay

## 2023-12-10 ENCOUNTER — Ambulatory Visit (HOSPITAL_COMMUNITY)
Admission: RE | Admit: 2023-12-10 | Discharge: 2023-12-10 | Disposition: A | Discharge status: Home / Self Care | Payer: 59 | Attending: Gastroenterology | Admitting: Gastroenterology

## 2023-12-10 ENCOUNTER — Encounter (HOSPITAL_COMMUNITY)
Admission: RE | Disposition: A | Discharge status: Home / Self Care | Payer: Self-pay | Source: Home / Self Care | Attending: Gastroenterology

## 2023-12-10 ENCOUNTER — Ambulatory Visit (HOSPITAL_COMMUNITY): Admitting: Anesthesiology

## 2023-12-10 ENCOUNTER — Encounter (HOSPITAL_COMMUNITY): Payer: Self-pay | Admitting: Gastroenterology

## 2023-12-10 DIAGNOSIS — E785 Hyperlipidemia, unspecified: Secondary | ICD-10-CM | POA: Diagnosis not present

## 2023-12-10 DIAGNOSIS — K921 Melena: Secondary | ICD-10-CM | POA: Diagnosis not present

## 2023-12-10 DIAGNOSIS — K644 Residual hemorrhoidal skin tags: Secondary | ICD-10-CM | POA: Diagnosis not present

## 2023-12-10 DIAGNOSIS — D128 Benign neoplasm of rectum: Secondary | ICD-10-CM

## 2023-12-10 DIAGNOSIS — K5900 Constipation, unspecified: Secondary | ICD-10-CM | POA: Diagnosis not present

## 2023-12-10 DIAGNOSIS — R569 Unspecified convulsions: Secondary | ICD-10-CM | POA: Diagnosis not present

## 2023-12-10 DIAGNOSIS — K5730 Diverticulosis of large intestine without perforation or abscess without bleeding: Secondary | ICD-10-CM

## 2023-12-10 DIAGNOSIS — I10 Essential (primary) hypertension: Secondary | ICD-10-CM

## 2023-12-10 DIAGNOSIS — Z79899 Other long term (current) drug therapy: Secondary | ICD-10-CM | POA: Insufficient documentation

## 2023-12-10 DIAGNOSIS — K648 Other hemorrhoids: Secondary | ICD-10-CM | POA: Diagnosis not present

## 2023-12-10 DIAGNOSIS — K621 Rectal polyp: Secondary | ICD-10-CM | POA: Diagnosis not present

## 2023-12-10 DIAGNOSIS — F79 Unspecified intellectual disabilities: Secondary | ICD-10-CM | POA: Insufficient documentation

## 2023-12-10 DIAGNOSIS — Q438 Other specified congenital malformations of intestine: Secondary | ICD-10-CM | POA: Diagnosis not present

## 2023-12-10 HISTORY — PX: COLONOSCOPY WITH PROPOFOL: SHX5780

## 2023-12-10 HISTORY — PX: POLYPECTOMY: SHX5525

## 2023-12-10 LAB — HM COLONOSCOPY

## 2023-12-10 SURGERY — COLONOSCOPY WITH PROPOFOL
Anesthesia: General

## 2023-12-10 MED ORDER — PROPOFOL 10 MG/ML IV BOLUS
INTRAVENOUS | Status: DC | PRN
  Administered 2023-12-10: 50 mg via INTRAVENOUS

## 2023-12-10 MED ORDER — LACTATED RINGERS IV SOLN: INTRAVENOUS | Status: DC

## 2023-12-10 MED ORDER — STERILE WATER FOR IRRIGATION IR SOLN
Status: DC | PRN
  Administered 2023-12-10: 100 mL

## 2023-12-10 MED ORDER — PROPOFOL 500 MG/50ML IV EMUL
INTRAVENOUS | Status: DC | PRN
  Administered 2023-12-10: 150 ug/kg/min via INTRAVENOUS

## 2023-12-10 NOTE — Transfer of Care (Addendum)
 Immediate Anesthesia Transfer of Care Note  Patient: Gerald Pierce  Procedure(s) Performed: COLONOSCOPY WITH PROPOFOL POLYPECTOMY  Patient Location: Short Stay  Anesthesia Type:General  Level of Consciousness: awake, alert , oriented, and patient cooperative  Airway & Oxygen Therapy: Patient Spontanous Breathing  Post-op Assessment: Report given to RN, Post -op Vital signs reviewed and stable, and Patient moving all extremities X 4  Post vital signs: Reviewed and stable  Last Vitals:  Vitals Value Taken Time  BP 111/66 1145  Temp 97.7 1144  Pulse 77 1144  Resp 16 1144  SpO2 99 1144    Last Pain:  Vitals:   12/10/23 1104  TempSrc:   PainSc: 0-No pain      Patients Stated Pain Goal: 6 (12/10/23 0959)  Complications: No notable events documented.

## 2023-12-10 NOTE — Op Note (Signed)
 [facility] Patient Name: Gerald Pierce Procedure Date: 12/10/2023 10:56 AM MRN: [id number] Date of Birth: 05-May-1961 Attending MD: Sanjuan Dame , MD, [id number] CSN: [id number] Age: 63 Admit Type: Outpatient Procedure:                Colonoscopy Indications:              Hematochezia Providers:                Sanjuan Dame, MD, Angelica Ran, Elinor Parkinson Referring MD:             Sanjuan Dame, MD Medicines:                Monitored Anesthesia Care Complications:            No immediate complications. Estimated Blood Loss:      Procedure:                Pre-Anesthesia Assessment:                           - Prior to the procedure, a History and Physical                            was performed, and patient medications and                            allergies were reviewed. The patient's tolerance of                            previous anesthesia was also reviewed. The risks                            and benefits of the procedure and the sedation                            options and risks were discussed with the patient.                            All questions were answered, and informed consent                            was obtained. Prior Anticoagulants: The patient has                            taken no anticoagulant or antiplatelet agents. ASA                            Grade Assessment: II - A patient with mild systemic                            disease. After reviewing the risks and benefits,                            the patient was deemed in satisfactory condition to  undergo the procedure.                           After obtaining informed consent, the colonoscope                            was passed under direct vision. Throughout the                            procedure, the patient's blood pressure, pulse, and                            oxygen saturations were monitored continuously. The                             [id number]) scope was introduced through the                            anus and advanced to the the terminal ileum. The                            colonoscopy was performed without difficulty. The                            patient tolerated the procedure well. The quality                            of the bowel preparation was evaluated using the                            BBPS [facility] Bowel Preparation Scale) with scores                            of: Right Colon = 3, Transverse Colon = 3 and Left                            Colon = 3 (entire mucosa seen well with no residual                            staining, small fragments of stool or opaque                            liquid). The total BBPS score equals 9. The                            terminal ileum, ileocecal valve, appendiceal                            orifice, and rectum were photographed. Scope In: 11:09:57 AM Scope Out: 11:36:16 AM Scope Withdrawal Time: 0 hours 15 minutes 24 seconds  Total Procedure Duration: 0 hours 26 minutes 19 seconds  Findings:      The perianal and digital rectal examinations were normal.      A 3 mm  polyp was found in the rectum. The polyp was sessile. The polyp       was removed with a cold snare. Resection and retrieval were complete.      Scattered small-mouthed diverticula were found in the left colon.      Non-bleeding external and internal hemorrhoids were found during       retroflexion. The hemorrhoids were medium-sized.      The left colon was significantly tortuous. Impression:               - One 3 mm polyp in the rectum, removed with a cold                            snare. Resected and retrieved.                           - Diverticulosis in the left colon.                           - Non-bleeding external and internal hemorrhoids.                           - Tortuous colon. Moderate Sedation:      Per Anesthesia Care Recommendation:           - Patient has a contact number  available for                            emergencies. The signs and symptoms of potential                            delayed complications were discussed with the                            patient. Return to normal activities tomorrow.                            Written discharge instructions were provided to the                            patient.                           - High fiber diet.                           - Continue present medications.                           - Await pathology results.                           - Repeat colonoscopy in 5 years for surveillance. Procedure Code(s):        --- Professional ---                           [phone], Colonoscopy, flexible; with removal of  tumor(s), polyp(s), or other lesion(s) by snare                            technique Diagnosis Code(s):        --- Professional ---                           D12.8, Benign neoplasm of rectum                           K64.8, Other hemorrhoids                           K92.1, Melena (includes Hematochezia)                           K57.30, Diverticulosis of large intestine without                            perforation or abscess without bleeding                           Q43.8, Other specified congenital malformations of                            intestine CPT copyright 2022 American Medical Association. All rights reserved. The codes documented in this report are preliminary and upon coder review may  be revised to meet current compliance requirements. Sanjuan Dame, MD Sanjuan Dame, MD 12/10/2023 11:44:48 AM This report has been signed electronically. Number of Addenda: 0

## 2023-12-10 NOTE — Discharge Instructions (Signed)

## 2023-12-10 NOTE — Interval H&P Note (Signed)
 History and Physical Interval Note:  12/10/2023 10:57 AM  Gerald Pierce  has presented today for surgery, with the diagnosis of HEMOTOCHEZIA.  The various methods of treatment have been discussed with the patient and family. After consideration of risks, benefits and other options for treatment, the patient has consented to  Procedure(s) with comments: COLONOSCOPY WITH PROPOFOL (N/A) - 10:45AM;ASA 1-2 as a surgical intervention.  The patient's history has been reviewed, patient examined, no change in status, stable for surgery.  I have reviewed the patient's chart and labs.  Questions were answered to the patient's satisfaction.     Juanetta Beets [name]

## 2023-12-10 NOTE — Anesthesia Preprocedure Evaluation (Signed)
 Anesthesia Evaluation  Patient identified by MRN, date of birth, ID band Patient awake    Reviewed: Allergy & Precautions, H&P , NPO status , Patient's Chart, lab work & pertinent test results, reviewed documented beta blocker date and time   Airway Mallampati: II  TM Distance: >3 FB Neck ROM: full    Dental no notable dental hx.    Pulmonary neg pulmonary ROS   Pulmonary exam normal breath sounds clear to auscultation       Cardiovascular Exercise Tolerance: Good hypertension, negative cardio ROS  Rhythm:regular Rate:Normal     Neuro/Psych Seizures -,  PSYCHIATRIC DISORDERS    Schizophrenia     GI/Hepatic negative GI ROS, Neg liver ROS,,,  Endo/Other  negative endocrine ROS    Renal/GU negative Renal ROS  negative genitourinary   Musculoskeletal   Abdominal   Peds  Hematology negative hematology ROS (+)   Anesthesia Other Findings   Reproductive/Obstetrics negative OB ROS                             Anesthesia Physical Anesthesia Plan  ASA: 3  Anesthesia Plan: General   Post-op Pain Management:    Induction:   PONV Risk Score and Plan: Propofol infusion  Airway Management Planned:   Additional Equipment:   Intra-op Plan:   Post-operative Plan:   Informed Consent: I have reviewed the patients History and Physical, chart, labs and discussed the procedure including the risks, benefits and alternatives for the proposed anesthesia with the patient or authorized representative who has indicated his/her understanding and acceptance.     Dental Advisory Given  Plan Discussed with: CRNA  Anesthesia Plan Comments:        Anesthesia Quick Evaluation

## 2023-12-11 ENCOUNTER — Encounter (HOSPITAL_COMMUNITY): Payer: Self-pay | Admitting: Gastroenterology

## 2023-12-11 LAB — SURGICAL PATHOLOGY

## 2023-12-12 ENCOUNTER — Encounter (INDEPENDENT_AMBULATORY_CARE_PROVIDER_SITE_OTHER): Payer: Self-pay | Admitting: *Deleted

## 2023-12-17 ENCOUNTER — Encounter (INDEPENDENT_AMBULATORY_CARE_PROVIDER_SITE_OTHER): Payer: Self-pay | Admitting: *Deleted

## 2023-12-17 NOTE — Anesthesia Postprocedure Evaluation (Signed)
 Anesthesia Post Note  Patient: Gerald Pierce  Procedure(s) Performed: COLONOSCOPY WITH PROPOFOL POLYPECTOMY  Patient location during evaluation: Phase II Anesthesia Type: General Level of consciousness: awake Pain management: pain level controlled Vital Signs Assessment: post-procedure vital signs reviewed and stable Respiratory status: spontaneous breathing and respiratory function stable Cardiovascular status: blood pressure returned to baseline and stable Postop Assessment: no headache and no apparent nausea or vomiting Anesthetic complications: no Comments: Late entry   No notable events documented.   Last Vitals:  Vitals:   12/10/23 1147 12/10/23 1151  BP: 111/66 115/70  Pulse: 70 67  Resp: (!) 22 18  Temp:    SpO2: 98% 99%    Last Pain:  Vitals:   12/10/23 1151  TempSrc:   PainSc: 0-No pain                 [name]

## 2024-01-17 ENCOUNTER — Ambulatory Visit (INDEPENDENT_AMBULATORY_CARE_PROVIDER_SITE_OTHER): Payer: Self-pay | Admitting: Internal Medicine

## 2024-01-17 ENCOUNTER — Encounter: Payer: Self-pay | Admitting: Internal Medicine

## 2024-01-17 VITALS — BP 116/72 | HR 75 | Ht 67.0 in | Wt 126.8 lb

## 2024-01-17 DIAGNOSIS — J302 Other seasonal allergic rhinitis: Secondary | ICD-10-CM | POA: Diagnosis not present

## 2024-01-17 DIAGNOSIS — D696 Thrombocytopenia, unspecified: Secondary | ICD-10-CM

## 2024-01-17 DIAGNOSIS — B351 Tinea unguium: Secondary | ICD-10-CM | POA: Diagnosis not present

## 2024-01-17 DIAGNOSIS — E785 Hyperlipidemia, unspecified: Secondary | ICD-10-CM | POA: Insufficient documentation

## 2024-01-17 DIAGNOSIS — F205 Residual schizophrenia: Secondary | ICD-10-CM

## 2024-01-17 DIAGNOSIS — E538 Deficiency of other specified B group vitamins: Secondary | ICD-10-CM | POA: Diagnosis not present

## 2024-01-17 DIAGNOSIS — E782 Mixed hyperlipidemia: Secondary | ICD-10-CM

## 2024-01-17 NOTE — Assessment & Plan Note (Signed)
 Noted on recent labs.  He has started daily vitamin B12 supplementation.

## 2024-01-17 NOTE — Assessment & Plan Note (Signed)
 Symptoms are adequately controlled with Allegra.

## 2024-01-17 NOTE — Assessment & Plan Note (Signed)
 He is currently prescribed simvastatin 40 mg daily.  Lipid panel from August 2024 reflects adequate control on this regimen.  No changes are indicated today.

## 2024-01-17 NOTE — Assessment & Plan Note (Signed)
 Currently prescribed ciclopirox for management of onychomycosis of the big toe of the right foot.  He is followed by podiatry but needs a new referral to establish care with a different provider.  Podiatry referral placed today.

## 2024-01-17 NOTE — Assessment & Plan Note (Signed)
 Chronic issue.  Followed by hematology-oncology (Dr. Anders Simmonds).  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  Follow-up and repeat labs are scheduled for August.

## 2024-01-17 NOTE — Progress Notes (Signed)
 New Patient Office Visit  Subjective    Patient ID: Gerald Pierce, male    DOB: Feb 21, 1961  Age: 63 y.o. MRN: [id number]  CC:  Chief Complaint  Patient presents with   Establish Care    HPI Gerald Pierce presents to establish care.  He is a 63 year old male with a previously documented past medical history significant for schizophrenia-residual type, mild MR, thrombocytopenia, HLD, seasonal allergies, and vitamin B12 deficiency.  Previously followed at [facility].  Mr. Okimoto reports feeling well today.  He is asymptomatic and has no acute concerns to discuss aside from desiring to establish care.  He lives at [facility].  No history of tobacco, alcohol, or illicit drug use.  No significant family medical history documented.  Chronic medical conditions and outstanding preventative care items discussed today are individually addressed in A/P below.   Outpatient Encounter Medications as of 01/17/2024  Medication Sig   betamethasone valerate (VALISONE) 0.1 % cream Apply topically 2 (two) times daily.   bisacodyl (DULCOLAX) 5 MG EC tablet Take by mouth.   brexpiprazole (REXULTI) 2 MG TABS tablet Take by mouth. One half tablet daily   ciclopirox (PENLAC) 8 % solution Apply topically at bedtime. Apply over nail and surrounding skin. Apply daily over previous coat. After seven (7) days, may remove with alcohol and continue cycle.   cyanocobalamin (VITAMIN B12) 1000 MCG tablet Take 1 tablet (1,000 mcg total) by mouth daily.   fexofenadine (ALLEGRA) 180 MG tablet Take 180 mg by mouth daily.   polyethylene glycol (MIRALAX / GLYCOLAX) 17 g packet Take 17 g by mouth 2 (two) times daily.   psyllium (METAMUCIL) 58.6 % packet Take 1 packet by mouth 2 (two) times daily.   simvastatin (ZOCOR) 40 MG tablet Take 40 mg by mouth daily at 6 PM.    triamcinolone cream (KENALOG) 0.1 % Apply 1 Application topically 2 (two) times daily.   vitamin C (ASCORBIC ACID) 500 MG tablet Take 500  mg by mouth daily.   [DISCONTINUED] FLUBLOK 0.5 ML SOSY  (Patient not taking: Reported on 01/17/2024)   [DISCONTINUED] fluticasone (FLONASE) 50 MCG/ACT nasal spray 1 spray Once Daily. (Patient not taking: Reported on 01/17/2024)   [DISCONTINUED] lurasidone (LATUDA) 40 MG TABS tablet Take 20 mg by mouth daily at 6 PM. (Patient not taking: Reported on 01/17/2024)   [DISCONTINUED] SPIKEVAX syringe  (Patient not taking: Reported on 01/17/2024)   No facility-administered encounter medications on file as of 01/17/2024.    Past Medical History:  Diagnosis Date   Mental retardation    Schizoaffective disorder (HCC)    Seizures (HCC)    hx 2011   Wears glasses     Past Surgical History:  Procedure Laterality Date   CHOLECYSTECTOMY     COLONOSCOPY WITH PROPOFOL N/A 12/10/2023   Procedure: COLONOSCOPY WITH PROPOFOL;  Surgeon: Franky Macho, MD;  [facility];  Service: Endoscopy;  Laterality: N/A;  10:45AM;ASA 1-2   HARDWARE REMOVAL Right 05/13/2015   Procedure: HARDWARE REMOVAL RIGHT WRIST;  Surgeon: Betha Loa, MD;  Location: [facility];  Service: Orthopedics;  Laterality: Right;   OPEN REDUCTION INTERNAL FIXATION (ORIF) DISTAL RADIAL FRACTURE Right 10/15/2014   Procedure: OPEN REDUCTION INTERNAL FIXATION (ORIF) RIGHT  DISTAL RADIAL FRACTURE;  Surgeon: Betha Loa, MD;  Location:  SURGERY CENTER;  Service: Orthopedics;  Laterality: Right;   POLYPECTOMY  12/10/2023   Procedure: POLYPECTOMY;  Surgeon: Franky Macho, MD;  Location: [facility];  Service: Endoscopy;;   History reviewed. No pertinent family history.  Social History   Socioeconomic History   Marital status: Single    Spouse name: Not on file   Number of children: Not on file   Years of education: Not on file   Highest education level: Not on file  Occupational History   Not on file  Tobacco Use   Smoking status: Never   Smokeless tobacco: Never  Vaping Use   Vaping status: Never Used   Substance and Sexual Activity   Alcohol use: No   Drug use: No   Sexual activity: Not on file  Other Topics Concern   Not on file  Social History Narrative   Not on file   Social Drivers of Health   Financial Resource Strain: Not on file  Food Insecurity: No Food Insecurity (11/21/2023)   Hunger Vital Sign    Worried About Running Out of Food in the Last Year: Never true    Ran Out of Food in the Last Year: Never true  Transportation Needs: No Transportation Needs (11/21/2023)   PRAPARE - Administrator, Civil Service (Medical): No    Lack of Transportation (Non-Medical): No  Physical Activity: Not on file  Stress: Not on file  Social Connections: Not on file  Intimate Partner Violence: Not At Risk (11/21/2023)   Humiliation, Afraid, Rape, and Kick questionnaire    Fear of Current or Ex-Partner: No    Emotionally Abused: No    Physically Abused: No    Sexually Abused: No   Review of Systems  Constitutional:  Negative for chills and fever.  HENT:  Negative for sore throat.   Respiratory:  Negative for cough and shortness of breath.   Cardiovascular:  Negative for chest pain, palpitations and leg swelling.  Gastrointestinal:  Negative for abdominal pain, blood in stool, constipation, diarrhea, nausea and vomiting.  Genitourinary:  Negative for dysuria and hematuria.  Musculoskeletal:  Negative for myalgias.  Skin:  Negative for itching and rash.  Neurological:  Negative for dizziness and headaches.  Psychiatric/Behavioral:  Negative for depression and suicidal ideas.    Objective    BP 116/72   Pulse 75   Ht 5\' 7"  (1.702 m)   Wt 126 lb 12.8 oz (57.5 kg)   SpO2 97%   BMI 19.86 kg/m   Physical Exam Vitals reviewed.  Constitutional:      General: He is not in acute distress.    Appearance: Normal appearance. He is not ill-appearing.  HENT:     Head: Normocephalic and atraumatic.     Right Ear: External ear normal.     Left Ear: External ear normal.      Nose: Nose normal. No congestion or rhinorrhea.     Mouth/Throat:     Mouth: Mucous membranes are moist.     Pharynx: Oropharynx is clear.  Eyes:     General: No scleral icterus.    Extraocular Movements: Extraocular movements intact.     Conjunctiva/sclera: Conjunctivae normal.     Pupils: Pupils are equal, round, and reactive to light.  Cardiovascular:     Rate and Rhythm: Normal rate and regular rhythm.     Pulses: Normal pulses.     Heart sounds: Normal heart sounds. No murmur heard. Pulmonary:     Effort: Pulmonary effort is normal.     Breath sounds: Normal breath sounds. No wheezing, rhonchi or rales.  Abdominal:     General: Abdomen is  flat. Bowel sounds are normal. There is no distension.     Palpations: Abdomen is soft.     Tenderness: There is no abdominal tenderness.  Musculoskeletal:        General: No swelling or deformity. Normal range of motion.     Cervical back: Normal range of motion.  Skin:    General: Skin is warm and dry.     Capillary Refill: Capillary refill takes less than 2 seconds.  Neurological:     General: No focal deficit present.     Mental Status: He is alert. Mental status is at baseline.     Motor: No weakness.  Psychiatric:        Mood and Affect: Mood normal.        Behavior: Behavior normal.        Thought Content: Thought content normal.    Assessment & Plan:   Problem List Items Addressed This Visit       Onychomycosis of right great toe - Primary   Currently prescribed ciclopirox for management of onychomycosis of the big toe of the right foot.  He is followed by podiatry but needs a new referral to establish care with a different provider.  Podiatry referral placed today.      Thrombocytopenia (HCC)   Chronic issue.  Followed by hematology-oncology (Dr. Anders Simmonds).  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  Follow-up and repeat labs are scheduled for August.      Vitamin B12 deficiency   Noted on recent labs.  He has started daily vitamin B12  supplementation.      Seasonal allergies   Symptoms are adequately controlled with Allegra.      Hyperlipidemia   He is currently prescribed simvastatin 40 mg daily.  Lipid panel from August 2024 reflects adequate control on this regimen.  No changes are indicated today.      Schizophrenia, residual type [facility])   Followed by psychiatry in [place] (Dr. Ave Filter).  He is prescribed Rexulti.      Return in about 3 months (around 04/17/2024).   Billie Lade, MD

## 2024-01-17 NOTE — Patient Instructions (Signed)
 It was a pleasure to see you today.  Thank you for giving [place] the opportunity to be involved in your care.  Below is a brief recap of your visit and next steps.  We will plan to see you again in 3 months.  Summary You have established care today No medication changes were made Podiatry referral placed Follow up in 3 months

## 2024-01-17 NOTE — Assessment & Plan Note (Signed)
 Followed by psychiatry in [place] (Dr. Ave Filter).  He is prescribed Rexulti.

## 2024-01-24 ENCOUNTER — Ambulatory Visit: Admitting: Podiatry

## 2024-01-24 DIAGNOSIS — B351 Tinea unguium: Secondary | ICD-10-CM | POA: Diagnosis not present

## 2024-01-24 DIAGNOSIS — Z79899 Other long term (current) drug therapy: Secondary | ICD-10-CM | POA: Diagnosis not present

## 2024-01-24 NOTE — Progress Notes (Signed)
 Subjective:  Patient ID: Gerald Pierce, male    DOB: [birth year],  MRN: [id number]  Chief Complaint  Patient presents with   Nail Problem    63 y.o. male presents with the above complaint.  Patient presents with bilateral hallux and second digit nail fungus with thickened and onychodystrophy mycotic nail x 4.  Patient wanted to discuss treatment options for his tried topical medication which has not helped he would like to try oral medication.  0 out of 10 pain scale.   Review of Systems: Negative except as noted in the HPI. Denies N/V/F/Ch.  Past Medical History:  Diagnosis Date   Mental retardation    Schizoaffective disorder (HCC)    Seizures (HCC)    hx 2011   Wears glasses     Current Outpatient Medications:    betamethasone valerate (VALISONE) 0.1 % cream, Apply topically 2 (two) times daily., Disp: , Rfl:    bisacodyl (DULCOLAX) 5 MG EC tablet, Take by mouth., Disp: , Rfl:    brexpiprazole (REXULTI) 2 MG TABS tablet, Take by mouth. One half tablet daily, Disp: , Rfl:    ciclopirox (PENLAC) 8 % solution, Apply topically at bedtime. Apply over nail and surrounding skin. Apply daily over previous coat. After seven (7) days, may remove with alcohol and continue cycle., Disp: , Rfl:    cyanocobalamin (VITAMIN B12) 1000 MCG tablet, Take 1 tablet (1,000 mcg total) by mouth daily., Disp: 90 tablet, Rfl: 2   fexofenadine (ALLEGRA) 180 MG tablet, Take 180 mg by mouth daily., Disp: , Rfl:    polyethylene glycol (MIRALAX / GLYCOLAX) 17 g packet, Take 17 g by mouth 2 (two) times daily., Disp: 180 packet, Rfl: 0   psyllium (METAMUCIL) 58.6 % packet, Take 1 packet by mouth 2 (two) times daily., Disp: 60 packet, Rfl: 2   simvastatin (ZOCOR) 40 MG tablet, Take 40 mg by mouth daily at 6 PM. , Disp: , Rfl:    triamcinolone cream (KENALOG) 0.1 %, Apply 1 Application topically 2 (two) times daily., Disp: , Rfl:    vitamin C (ASCORBIC ACID) 500 MG tablet, Take 500 mg by mouth daily., Disp: ,  Rfl:   Social History   Tobacco Use  Smoking Status Never  Smokeless Tobacco Never    No Known Allergies Objective:  There were no vitals filed for this visit. There is no height or weight on file to calculate BMI. Constitutional Well developed. Well nourished.  Vascular Dorsalis pedis pulses palpable bilaterally. Posterior tibial pulses palpable bilaterally. Capillary refill normal to all digits.  No cyanosis or clubbing noted. Pedal hair growth normal.  Neurologic Normal speech. Oriented to person, place, and time. Epicritic sensation to light touch grossly present bilaterally.  Dermatologic Nails thickened onychodystrophy mycotic toenails x 2 bilateral hallux Skin within normal limits  Orthopedic: Normal joint ROM without pain or crepitus bilaterally. No visible deformities. No bony tenderness.   Radiographs: None Assessment:   1. Nail fungus   2. Onychomycosis due to dermatophyte   3. Long-term use of high-risk medication    Plan:  Patient was evaluated and treated and all questions answered.  Bilateral hallux/second digit onychomycosis x 4 -Educated the patient on the etiology of onychomycosis and various treatment options associated with improving the fungal load.  I explained to the patient that there is 3 treatment options available to treat the onychomycosis including topical, p.o., laser treatment.  Patient elected to undergo p.o. options with Lamisil/terbinafine therapy.  In order for me to start  the medication therapy, I explained to the patient the importance of evaluating the liver and obtaining the liver function test.  Once the liver function test comes back normal I will start him on 29-month course of Lamisil therapy.  Patient understood all risk and would like to proceed with Lamisil therapy.  I have asked the patient to immediately stop the Lamisil therapy if she has any reactions to it and call the office or go to the emergency room right away.  Patient  states understanding   No follow-ups on file.

## 2024-01-25 LAB — HEPATIC FUNCTION PANEL
ALT: 34 IU/L (ref 0–44)
AST: 26 IU/L (ref 0–40)
Albumin: 4.3 g/dL (ref 3.8–4.9)
Alkaline Phosphatase: 73 IU/L (ref 44–121)
Bilirubin Total: 0.8 mg/dL (ref 0.0–1.2)
Bilirubin, Direct: 0.28 mg/dL (ref 0.00–0.40)
Total Protein: 6.6 g/dL (ref 6.0–8.5)

## 2024-01-28 MED ORDER — TERBINAFINE HCL 250 MG PO TABS: 250.0000 mg | ORAL_TABLET | Freq: Every day | ORAL | 0 refills | Status: AC

## 2024-01-28 NOTE — Addendum Note (Signed)
 Addended by: Emiyah Spraggins on: 01/28/2024 01:08 PM   Modules accepted: Orders

## 2024-02-15 DIAGNOSIS — Z79899 Other long term (current) drug therapy: Secondary | ICD-10-CM | POA: Diagnosis not present

## 2024-02-18 ENCOUNTER — Telehealth: Payer: Self-pay | Admitting: Internal Medicine

## 2024-02-18 NOTE — Telephone Encounter (Signed)
 Medication is not on med list, requested the facility to ask pharmacy to send us  a refill request

## 2024-02-18 NOTE — Telephone Encounter (Signed)
 Prescription Request  02/18/2024  LOV: 01/17/2024  What is the name of the medication or equipment?   Acetaminophen  325 mg  Hydroxyzine HCL 10 mg  Ondansetron  HCL 4mg   Have you contacted your pharmacy to request a refill? No   Which pharmacy would you like this sent to?  [facility] - [place], [place] - [address] [place] [address] Phone: [phone] Fax: [phone]    Patient notified that their request is being sent to the clinical staff for review and that they should receive a response within 2 business days.   Please advise at group home came into office

## 2024-02-22 NOTE — Telephone Encounter (Signed)
 Representative from Facility came by office in regard to refills on medication Spoke with pharmacy and pharm states that the provider needs to send in med request. Pharm is unable to change patient provider.   Medications in previous tele encounter

## 2024-02-25 ENCOUNTER — Other Ambulatory Visit: Payer: Self-pay

## 2024-02-25 ENCOUNTER — Telehealth: Payer: Self-pay

## 2024-02-25 MED ORDER — ONDANSETRON HCL 4 MG PO TABS: 4.0000 mg | ORAL_TABLET | Freq: Three times a day (TID) | ORAL | 0 refills | Status: DC | PRN

## 2024-02-25 MED ORDER — HYDROXYZINE HCL 10 MG PO TABS: 10.0000 mg | ORAL_TABLET | Freq: Four times a day (QID) | ORAL | 0 refills | Status: AC | PRN

## 2024-02-25 MED ORDER — ACETAMINOPHEN 325 MG PO TABS: 650.0000 mg | ORAL_TABLET | Freq: Two times a day (BID) | ORAL | 0 refills | Status: AC | PRN

## 2024-02-25 NOTE — Telephone Encounter (Signed)
 Refills sent to pharmacy.

## 2024-02-25 NOTE — Telephone Encounter (Signed)
 Please see note below regard med reconciliation; please advise.    Copied from CRM [id number]. Topic: Clinical - Prescription Issue >> Feb 25, 2024 10:27 AM Everette C wrote: Reason for CRM: The patient's group home care provider Carlene would like to speak with a member of clinical staff when possible to review the patient's current and active medications

## 2024-02-25 NOTE — Telephone Encounter (Signed)
 Spoke to care provider this morning

## 2024-02-27 DIAGNOSIS — Z79899 Other long term (current) drug therapy: Secondary | ICD-10-CM | POA: Diagnosis not present

## 2024-02-28 ENCOUNTER — Telehealth: Payer: Self-pay | Admitting: *Deleted

## 2024-02-28 ENCOUNTER — Telehealth: Payer: Self-pay | Admitting: Gastroenterology

## 2024-02-28 NOTE — Telephone Encounter (Signed)
 Gerald Pierce, [facility], came to front desk saying [name] didn't receive an order and handed me his AVS from his last OV. Please call Gerald Pierce at [phone] I made copy of AVS front page with the things she had hi lited and gave to Tanya.

## 2024-02-28 NOTE — Telephone Encounter (Signed)
 Newton Barer from [facility] called to advise of PLT of 98 drawn at another practice.  Denies any complaints at this time.  Advised that he is within his usual range for his PLT count and we will follow up as planned in August unless he should develop symptoms.  Verbalized understanding.  She will provide office with results.

## 2024-02-29 NOTE — Telephone Encounter (Signed)
 Gerald Pierce contacted. She was questioned on her med review by Group Home pharmacist about Metamucil. Is patient to continue Metamucil BID? Pt had TCS in March and US  in February. Group Home Rep states when I get clarification she will be by the office with one of their after visit sheets so clarification can be documented. Advised her that we do close at 11:30am on Friday. Please advise. Thank you!

## 2024-03-04 NOTE — Telephone Encounter (Signed)
 Hello, Patient to continue metamucil once daily

## 2024-03-04 NOTE — Telephone Encounter (Signed)
 Left detailed message on Gerald Pierce voicemail (ok per DPR).

## 2024-03-10 NOTE — Telephone Encounter (Signed)
 Form signed and faxed to [facility]. Form will also be scanned in patients chart.

## 2024-03-12 ENCOUNTER — Other Ambulatory Visit (INDEPENDENT_AMBULATORY_CARE_PROVIDER_SITE_OTHER): Payer: Self-pay

## 2024-03-12 ENCOUNTER — Telehealth (INDEPENDENT_AMBULATORY_CARE_PROVIDER_SITE_OTHER): Payer: Self-pay | Admitting: Gastroenterology

## 2024-03-12 DIAGNOSIS — K5904 Chronic idiopathic constipation: Secondary | ICD-10-CM

## 2024-03-12 MED ORDER — PSYLLIUM 58.6 % PO PACK: 1.0000 | PACK | Freq: Two times a day (BID) | ORAL | 2 refills | Status: AC

## 2024-03-12 NOTE — Telephone Encounter (Signed)
 Can we send Metamucil into [facility]? Please advise. Thank you.

## 2024-03-12 NOTE — Telephone Encounter (Signed)
 Carlene from [facility] stopped by front desk asking if the patient's metamucil had been sent to [facility] (not [facility]). [phone]

## 2024-03-12 NOTE — Telephone Encounter (Signed)
 Metamucil sent to [facility]. Carelene at group home contacted and made aware

## 2024-03-31 ENCOUNTER — Telehealth: Payer: Self-pay | Admitting: Podiatry

## 2024-03-31 ENCOUNTER — Other Ambulatory Visit: Payer: Self-pay | Admitting: Podiatry

## 2024-03-31 NOTE — Telephone Encounter (Signed)
 Gerald Pierce ([facility]) called in regards to patient's Terbinafine  prescription. Patient has taken the full 90 day course and is needing a d/c order sent to his pharmacy  [facility] [phone] and sent to [facility] @ [email or web address]. Thanks!

## 2024-04-01 ENCOUNTER — Other Ambulatory Visit: Payer: Self-pay | Admitting: Internal Medicine

## 2024-04-16 ENCOUNTER — Ambulatory Visit

## 2024-04-16 VITALS — BP 120/70 | HR 65 | Ht 65.0 in | Wt 127.0 lb

## 2024-04-16 DIAGNOSIS — Z0001 Encounter for general adult medical examination with abnormal findings: Secondary | ICD-10-CM

## 2024-04-16 DIAGNOSIS — Z0000 Encounter for general adult medical examination without abnormal findings: Secondary | ICD-10-CM

## 2024-04-16 DIAGNOSIS — R634 Abnormal weight loss: Secondary | ICD-10-CM

## 2024-04-16 MED ORDER — ENSURE HIGH PROTEIN PO LIQD: 1.0000 | Freq: Every day | ORAL | 11 refills | Status: AC

## 2024-04-16 NOTE — Progress Notes (Unsigned)
 Complete physical exam  Patient: Gerald Pierce   DOB: 12-21-1960   63 y.o. Male  MRN: [id number]  Subjective:    Chief Complaint  Patient presents with   Medical Management of Chronic Issues    Group home manager states needs physicians standing orders signed and inquiring about a physical     Gerald Pierce is a 63 y.o. male who presents today for a complete physical exam. He reports consuming a {diet types:17450} diet. {types:19826} He generally feels {DESC; WELL/FAIRLY WELL/POORLY:18703}. He reports sleeping {DESC; WELL/FAIRLY WELL/POORLY:18703}. He {does/does not:200015} have additional problems to discuss today.    Most recent fall risk assessment:    01/17/2024    2:32 PM  Fall Risk   Falls in the past year? 0  Number falls in past yr: 0  Injury with Fall? 0  Risk for fall due to : No Fall Risks  Follow up Falls evaluation completed     Most recent depression screenings:    01/17/2024    2:53 PM 11/21/2023   12:55 PM  PHQ 2/9 Scores  PHQ - 2 Score 1 0  PHQ- 9 Score 2     {VISON DENTAL STD PSA (Optional):27386}  {History (Optional):23778}  Patient Care Team: Bevely Doffing, FNP as PCP - General (Family Medicine)   Outpatient Medications Prior to Visit  Medication Sig   acetaminophen  (TYLENOL ) 325 MG tablet Take 2 tablets (650 mg total) by mouth 2 (two) times daily as needed.   betamethasone valerate (VALISONE) 0.1 % cream Apply topically 2 (two) times daily.   bisacodyl (DULCOLAX) 5 MG EC tablet Take by mouth.   brexpiprazole (REXULTI) 2 MG TABS tablet Take by mouth. One half tablet daily   ciclopirox (PENLAC) 8 % solution Apply topically at bedtime. Apply over nail and surrounding skin. Apply daily over previous coat. After seven (7) days, may remove with alcohol  and continue cycle.   cyanocobalamin  (VITAMIN B12) 1000 MCG tablet Take 1 tablet (1,000 mcg total) by mouth daily.   fexofenadine (ALLEGRA) 180 MG tablet Take 180 mg by mouth daily.    hydrOXYzine  (ATARAX ) 10 MG tablet Take 1 tablet (10 mg total) by mouth every 6 (six) hours as needed.   ondansetron  (ZOFRAN ) 4 MG tablet TAKE 1 TABLET BY MOUTH EVERY 8 HOURS AS NEEDED FOR NAUSEA AND VOMITING   psyllium (METAMUCIL) 58.6 % packet Take 1 packet by mouth 2 (two) times daily.   simvastatin (ZOCOR) 40 MG tablet Take 40 mg by mouth daily at 6 PM.    terbinafine  (LAMISIL ) 250 MG tablet Take 1 tablet (250 mg total) by mouth daily.   triamcinolone cream (KENALOG) 0.1 % Apply 1 Application topically 2 (two) times daily.   vitamin C (ASCORBIC ACID) 500 MG tablet Take 500 mg by mouth daily.   No facility-administered medications prior to visit.    ROS        Objective:     BP 120/70   Pulse 65   Ht 5' 5 (1.651 m)   Wt 127 lb 0.6 oz (57.6 kg)   SpO2 95%   BMI 21.14 kg/m  BP Readings from Last 3 Encounters:  04/16/24 120/70  01/17/24 116/72  12/10/23 115/70   Wt Readings from Last 3 Encounters:  04/16/24 127 lb 0.6 oz (57.6 kg)  01/17/24 126 lb 12.8 oz (57.5 kg)  12/10/23 128 lb (58.1 kg)      Physical Exam Vitals and nursing note reviewed.  Constitutional:  Appearance: Normal appearance.  HENT:     Head: Normocephalic.     Right Ear: Tympanic membrane, ear canal and external ear normal.     Left Ear: Tympanic membrane, ear canal and external ear normal.     Nose: Nose normal.     Mouth/Throat:     Mouth: Mucous membranes are moist.     Pharynx: Oropharynx is clear.  Eyes:     General: Lids are normal.     Extraocular Movements: Extraocular movements intact.     Pupils: Pupils are equal, round, and reactive to light.  Cardiovascular:     Rate and Rhythm: Normal rate and regular rhythm.  Pulmonary:     Effort: Pulmonary effort is normal.     Breath sounds: Normal breath sounds.  Musculoskeletal:     Cervical back: Normal range of motion and neck supple.  Skin:    General: Skin is warm and dry.  Neurological:     Mental Status: He is alert and  oriented to person, place, and time.  Psychiatric:        Mood and Affect: Mood normal.        Thought Content: Thought content normal.      No results found for any visits on 04/16/24. {Show previous labs (optional):23779}    Assessment & Plan:    Routine Health Maintenance and Physical Exam  Immunization History  Administered Date(s) Administered   Influenza, Quadrivalent, Recombinant, Inj, Pf 08/18/2021    Health Maintenance  Topic Date Due   COVID-19 Vaccine (1) Never done   DTaP/Tdap/Td (1 - Tdap) Never done   Zoster Vaccines- Shingrix (1 of 2) Never done   Medicare Annual Wellness (AWV)  03/07/2024   INFLUENZA VACCINE  05/09/2024   Colonoscopy  12/09/2028   Hepatitis C Screening  Completed   HIV Screening  Completed   Hepatitis B Vaccines  Aged Out   HPV VACCINES  Aged Out   Meningococcal B Vaccine  Aged Out    Discussed health benefits of physical activity, and encouraged him to engage in regular exercise appropriate for his age and condition.  Problem List Items Addressed This Visit   None  No follow-ups on file.     Leita Longs, FNP

## 2024-05-07 ENCOUNTER — Ambulatory Visit (INDEPENDENT_AMBULATORY_CARE_PROVIDER_SITE_OTHER): Admitting: Podiatry

## 2024-05-07 DIAGNOSIS — Z79899 Other long term (current) drug therapy: Secondary | ICD-10-CM

## 2024-05-07 DIAGNOSIS — M722 Plantar fascial fibromatosis: Secondary | ICD-10-CM

## 2024-05-07 DIAGNOSIS — B351 Tinea unguium: Secondary | ICD-10-CM

## 2024-05-07 NOTE — Progress Notes (Signed)
 Subjective:  Patient ID: Gerald Pierce, male    DOB: [birth year],  MRN: [id number]  Chief Complaint  Patient presents with   Nail Problem    63 y.o. male presents with the above complaint.  Patient presents with bilateral hallux and second digit nail fungus with thickened and onychodystrophy mycotic nail x 4.  Patient wanted to discuss treatment options for his tried topical medication which has not helped he would like to try oral medication.  0 out of 10 pain scale.   Review of Systems: Negative except as noted in the HPI. Denies N/V/F/Ch.  Past Medical History:  Diagnosis Date   Mental retardation    Schizoaffective disorder (HCC)    Seizures (HCC)    hx 2011   Wears glasses     Current Outpatient Medications:    acetaminophen  (TYLENOL ) 325 MG tablet, Take 2 tablets (650 mg total) by mouth 2 (two) times daily as needed., Disp: 180 tablet, Rfl: 0   betamethasone valerate (VALISONE) 0.1 % cream, Apply topically 2 (two) times daily., Disp: , Rfl:    bisacodyl (DULCOLAX) 5 MG EC tablet, Take by mouth., Disp: , Rfl:    brexpiprazole (REXULTI) 2 MG TABS tablet, Take by mouth. One half tablet daily, Disp: , Rfl:    ciclopirox (PENLAC) 8 % solution, Apply topically at bedtime. Apply over nail and surrounding skin. Apply daily over previous coat. After seven (7) days, may remove with alcohol  and continue cycle., Disp: , Rfl:    cyanocobalamin  (VITAMIN B12) 1000 MCG tablet, Take 1 tablet (1,000 mcg total) by mouth daily., Disp: 90 tablet, Rfl: 2   fexofenadine (ALLEGRA) 180 MG tablet, Take 180 mg by mouth daily., Disp: , Rfl:    hydrOXYzine  (ATARAX ) 10 MG tablet, Take 1 tablet (10 mg total) by mouth every 6 (six) hours as needed., Disp: 180 tablet, Rfl: 0   Nutritional Supplements (ENSURE HIGH PROTEIN) LIQD, Take 1 Bottle by mouth daily., Disp: 7110 mL, Rfl: 11   ondansetron  (ZOFRAN ) 4 MG tablet, TAKE 1 TABLET BY MOUTH EVERY 8 HOURS AS NEEDED FOR NAUSEA AND VOMITING, Disp: 20 tablet, Rfl:  0   psyllium (METAMUCIL) 58.6 % packet, Take 1 packet by mouth 2 (two) times daily., Disp: 60 packet, Rfl: 2   simvastatin (ZOCOR) 40 MG tablet, Take 40 mg by mouth daily at 6 PM. , Disp: , Rfl:    terbinafine  (LAMISIL ) 250 MG tablet, Take 1 tablet (250 mg total) by mouth daily., Disp: 90 tablet, Rfl: 0   triamcinolone cream (KENALOG) 0.1 %, Apply 1 Application topically 2 (two) times daily., Disp: , Rfl:    vitamin C (ASCORBIC ACID) 500 MG tablet, Take 500 mg by mouth daily., Disp: , Rfl:   Social History   Tobacco Use  Smoking Status Never  Smokeless Tobacco Never    No Known Allergies Objective:  There were no vitals filed for this visit. There is no height or weight on file to calculate BMI. Constitutional Well developed. Well nourished.  Vascular Dorsalis pedis pulses palpable bilaterally. Posterior tibial pulses palpable bilaterally. Capillary refill normal to all digits.  No cyanosis or clubbing noted. Pedal hair growth normal.  Neurologic Normal speech. Oriented to person, place, and time. Epicritic sensation to light touch grossly present bilaterally.  Dermatologic Nails thickened onychodystrophy mycotic toenails x 2 bilateral hallux Skin within normal limits  Orthopedic: Pain on palpation left calcaneal tuber.  Pain with dorsiflexion of the digits.  Positive Silfverskiold test noted with gastrocnemius equinus  Radiographs: None Assessment:   1. Long-term use of high-risk medication   2. Nail fungus   3. Onychomycosis due to dermatophyte   4. Plantar fasciitis of left foot    Plan:  Patient was evaluated and treated and all questions answered.  Plantar Fasciitis, left - XR reviewed as above.  - Educated on icing and stretching. Instructions given.  - Injection delivered to the plantar fascia as below. - DME: Plantar fascial brace dispensed to support the medial longitudinal arch of the foot and offload pressure from the heel and prevent arch collapse during  weightbearing - Pharmacologic management: None  Procedure: Injection Tendon/Ligament Location: Left plantar fascia at the glabrous junction; medial approach. Skin Prep: alcohol  Injectate: 0.5 cc 0.5% marcaine  plain, 0.5 cc of 1% Lidocaine , 0.5 cc kenalog 10. Disposition: Patient tolerated procedure well. Injection site dressed with a band-aid.  No follow-ups on file.  Bilateral hallux/second digit onychomycosis x 4~second round -Educated the patient on the etiology of onychomycosis and various treatment options associated with improving the fungal load.  I explained to the patient that there is 3 treatment options available to treat the onychomycosis including topical, p.o., laser treatment.  Patient elected to undergo p.o. options with Lamisil /terbinafine  therapy.  In order for me to start the medication therapy, I explained to the patient the importance of evaluating the liver and obtaining the liver function test.  Once the liver function test comes back normal I will start him on 61-month course of Lamisil  therapy.  Patient understood all risk and would like to proceed with Lamisil  therapy.  I have asked the patient to immediately stop the Lamisil  therapy if she has any reactions to it and call the office or go to the emergency room right away.  Patient states understanding   No follow-ups on file.

## 2024-05-08 DIAGNOSIS — Z79899 Other long term (current) drug therapy: Secondary | ICD-10-CM | POA: Diagnosis not present

## 2024-05-09 LAB — HEPATIC FUNCTION PANEL
ALT: 28 IU/L (ref 0–44)
AST: 19 IU/L (ref 0–40)
Albumin: 4.4 g/dL (ref 3.8–4.9)
Alkaline Phosphatase: 70 IU/L (ref 44–121)
Bilirubin Total: 0.7 mg/dL (ref 0.0–1.2)
Bilirubin, Direct: 0.26 mg/dL (ref 0.00–0.40)
Total Protein: 6.5 g/dL (ref 6.0–8.5)

## 2024-05-09 MED ORDER — TERBINAFINE HCL 250 MG PO TABS: 250.0000 mg | ORAL_TABLET | Freq: Every day | ORAL | 0 refills | Status: AC

## 2024-05-09 NOTE — Addendum Note (Signed)
 Addended by: Carry Ortez on: 05/09/2024 09:20 AM   Modules accepted: Orders

## 2024-05-28 ENCOUNTER — Inpatient Hospital Stay: Payer: 59 | Attending: Oncology

## 2024-05-28 ENCOUNTER — Ambulatory Visit: Admitting: Podiatry

## 2024-05-28 DIAGNOSIS — E538 Deficiency of other specified B group vitamins: Secondary | ICD-10-CM | POA: Diagnosis not present

## 2024-05-28 DIAGNOSIS — D696 Thrombocytopenia, unspecified: Secondary | ICD-10-CM | POA: Diagnosis not present

## 2024-05-28 DIAGNOSIS — R7989 Other specified abnormal findings of blood chemistry: Secondary | ICD-10-CM | POA: Diagnosis not present

## 2024-05-28 LAB — CBC WITH DIFFERENTIAL/PLATELET
Abs Immature Granulocytes: 0.02 K/uL (ref 0.00–0.07)
Basophils Absolute: 0 K/uL (ref 0.0–0.1)
Basophils Relative: 0 %
Eosinophils Absolute: 0 K/uL (ref 0.0–0.5)
Eosinophils Relative: 0 %
HCT: 46.9 % (ref 39.0–52.0)
Hemoglobin: 16.4 g/dL (ref 13.0–17.0)
Immature Granulocytes: 0 %
Lymphocytes Relative: 7 %
Lymphs Abs: 0.6 K/uL — ABNORMAL LOW (ref 0.7–4.0)
MCH: 32 pg (ref 26.0–34.0)
MCHC: 35 g/dL (ref 30.0–36.0)
MCV: 91.4 fL (ref 80.0–100.0)
Monocytes Absolute: 0.7 K/uL (ref 0.1–1.0)
Monocytes Relative: 8 %
Neutro Abs: 7.4 K/uL (ref 1.7–7.7)
Neutrophils Relative %: 85 %
Platelets: 98 K/uL — ABNORMAL LOW (ref 150–400)
RBC: 5.13 MIL/uL (ref 4.22–5.81)
RDW: 13.2 % (ref 11.5–15.5)
WBC: 8.8 K/uL (ref 4.0–10.5)
nRBC: 0 % (ref 0.0–0.2)

## 2024-05-28 LAB — COMPREHENSIVE METABOLIC PANEL WITH GFR
ALT: 33 U/L (ref 0–44)
AST: 24 U/L (ref 15–41)
Albumin: 4.2 g/dL (ref 3.5–5.0)
Alkaline Phosphatase: 65 U/L (ref 38–126)
Anion gap: 12 (ref 5–15)
BUN: 14 mg/dL (ref 6–20)
CO2: 24 mmol/L (ref 22–32)
Calcium: 9.2 mg/dL (ref 8.9–10.3)
Chloride: 103 mmol/L (ref 98–111)
Creatinine, Ser: 0.78 mg/dL (ref 0.61–1.24)
GFR, Estimated: >60 mL/min (ref >60)
Glucose, Bld: 152 mg/dL — ABNORMAL HIGH (ref 70–99)
Potassium: 3.9 mmol/L (ref 3.5–5.1)
Sodium: 139 mmol/L (ref 135–145)
Total Bilirubin: 1.7 mg/dL — ABNORMAL HIGH (ref 0.0–1.2)
Total Protein: 7 g/dL (ref 6.5–8.1)

## 2024-05-28 LAB — VITAMIN B12: Vitamin B-12: 790 pg/mL (ref 180–914)

## 2024-06-04 ENCOUNTER — Inpatient Hospital Stay (HOSPITAL_BASED_OUTPATIENT_CLINIC_OR_DEPARTMENT_OTHER): Payer: 59 | Admitting: Oncology

## 2024-06-04 VITALS — BP 114/79 | HR 77 | Temp 98.0°F | Resp 18 | Wt 127.2 lb

## 2024-06-04 DIAGNOSIS — E538 Deficiency of other specified B group vitamins: Secondary | ICD-10-CM

## 2024-06-04 DIAGNOSIS — R17 Unspecified jaundice: Secondary | ICD-10-CM | POA: Insufficient documentation

## 2024-06-04 DIAGNOSIS — D696 Thrombocytopenia, unspecified: Secondary | ICD-10-CM | POA: Diagnosis not present

## 2024-06-04 DIAGNOSIS — R7989 Other specified abnormal findings of blood chemistry: Secondary | ICD-10-CM | POA: Diagnosis not present

## 2024-06-04 NOTE — Assessment & Plan Note (Addendum)
 He is followed by gastroenterology and recently had a colonoscopy for hematochezia. He denies any abdominal pain. Repeat lab in 3 months. He also had a recent ultrasound which did not reveal hepatosplenomegaly.  Will reach out to GI but likely will just repeat his labs in a few months.

## 2024-06-04 NOTE — Assessment & Plan Note (Addendum)
 Patient has mild vitamin B12 deficiency. Repeat B12 level shows improvement.  - continue cyanocobalamin  1000 mcg daily

## 2024-06-04 NOTE — Progress Notes (Signed)
 [facility] OFFICE PROGRESS NOTE  Gerald Doffing, FNP  ASSESSMENT & PLAN:   Assessment & Plan Thrombocytopenia [facility]) Patient has chronic thrombocytopenia since 2011.  Does not report bleeding or petechiae.  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  Recent ultrasound showed no splenomegaly or hepatomegaly.  Hepatitis and HIV panel negative. Platelet counts are stable.  -Continue to monitor counts at this time.  Return to clinic in 6 months with labs. Vitamin B12 deficiency Patient has mild vitamin B12 deficiency. Repeat B12 level shows improvement.  - continue cyanocobalamin  1000 mcg daily Elevated bilirubin He is followed by gastroenterology and recently had a colonoscopy for hematochezia. He denies any abdominal pain. Repeat lab in 3 months. He also had a recent ultrasound which did not reveal hepatosplenomegaly.  Will reach out to GI but likely will just repeat his labs in a few months.  Orders Placed This Encounter  Procedures   Comprehensive metabolic panel    Standing Status:   Future    Expected Date:   12/05/2024    Expiration Date:   03/05/2025   CBC with Differential/Platelet    Standing Status:   Future    Expected Date:   12/05/2024    Expiration Date:   03/05/2025   Vitamin B12    Standing Status:   Future    Expected Date:   12/05/2024    Expiration Date:   03/05/2025    INTERVAL HISTORY: Patient returns for follow-up for thrombocytopenia and B12 deficiency.  Reports he has been taking B12 supplements daily.  Denies any abdominal pain, nausea, vomiting or changes in his stool.  Since his last visit, patient had colonoscopy due to hematochezia.  Results showed a 3 mm polyp in the rectum, diverticulosis and nonbleeding external and internal hemorrhoids.  Tortuous colon.  Pathology revealed tubular adenoma and it was recommended for a 5-year follow-up.  We reviewed CMP, B12 level and CBC.  SUMMARY OF HEMATOLOGIC HISTORY: Oncology  History   No history exists.     CBC    Component Value Date/Time   WBC 8.8 05/28/2024 0830   RBC 5.13 05/28/2024 0830   HGB 16.4 05/28/2024 0830   HGB 15.5 11/14/2023 1505   HCT 46.9 05/28/2024 0830   HCT 44.5 11/14/2023 1505   PLT 98 (L) 05/28/2024 0830   PLT 99 (LL) 11/14/2023 1505   MCV 91.4 05/28/2024 0830   MCV 94 11/14/2023 1505   MCH 32.0 05/28/2024 0830   MCHC 35.0 05/28/2024 0830   RDW 13.2 05/28/2024 0830   RDW 13.0 11/14/2023 1505   LYMPHSABS 0.6 (L) 05/28/2024 0830   LYMPHSABS 0.6 (L) 11/14/2023 1505   MONOABS 0.7 05/28/2024 0830   EOSABS 0.0 05/28/2024 0830   EOSABS 0.0 11/14/2023 1505   BASOSABS 0.0 05/28/2024 0830   BASOSABS 0.0 11/14/2023 1505       Latest Ref Rng & Units 05/28/2024    8:30 AM 05/08/2024    8:51 AM 01/24/2024   10:44 AM  CMP  Glucose 70 - 99 mg/dL 847     BUN 6 - 20 mg/dL 14     Creatinine 9.38 - 1.24 mg/dL 9.21     Sodium 864 - 854 mmol/L 139     Potassium 3.5 - 5.1 mmol/L 3.9     Chloride 98 - 111 mmol/L 103     CO2 22 - 32 mmol/L 24     Calcium 8.9 - 10.3 mg/dL 9.2     Total Protein  6.5 - 8.1 g/dL 7.0  6.5  6.6   Total Bilirubin 0.0 - 1.2 mg/dL 1.7  0.7  0.8   Alkaline Phos 38 - 126 U/L 65  70  73   AST 15 - 41 U/L 24  19  26    ALT 0 - 44 U/L 33  28  34      Lab Results  Component Value Date   FERRITIN 129 11/21/2023   VITAMINB12 790 05/28/2024    There were no vitals filed for this visit.  Review of System:  Review of Systems  Constitutional:  Positive for malaise/fatigue.  Respiratory:  Negative for cough.   Gastrointestinal:  Negative for abdominal pain, blood in stool, constipation, diarrhea and melena.  Skin:  Negative for rash.  Neurological:  Negative for dizziness and headaches.  Psychiatric/Behavioral:  The patient is not nervous/anxious.     Physical Exam: Physical Exam Constitutional:      Appearance: Normal appearance.  HENT:     Head: Normocephalic and atraumatic.  Eyes:     Pupils: Pupils are  equal, round, and reactive to light.  Cardiovascular:     Rate and Rhythm: Normal rate and regular rhythm.     Heart sounds: Normal heart sounds. No murmur heard. Pulmonary:     Effort: Pulmonary effort is normal.     Breath sounds: Normal breath sounds. No wheezing.  Abdominal:     General: Bowel sounds are normal. There is no distension.     Palpations: Abdomen is soft.     Tenderness: There is no abdominal tenderness.  Musculoskeletal:        General: Normal range of motion.     Cervical back: Normal range of motion.  Skin:    General: Skin is warm and dry.     Findings: No rash.  Neurological:     Mental Status: He is alert and oriented to person, place, and time.     Gait: Gait is intact.  Psychiatric:        Mood and Affect: Mood and affect normal.        Cognition and Memory: Memory normal.        Judgment: Judgment normal.      I spent 25 minutes dedicated to the care of this patient (face-to-face and non-face-to-face) on the date of the encounter to include what is described in the assessment and plan.,  Gerald Hope, NP 06/04/2024 8:16 AM

## 2024-06-04 NOTE — Assessment & Plan Note (Addendum)
 Patient has chronic thrombocytopenia since 2011.  Does not report bleeding or petechiae.  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  Recent ultrasound showed no splenomegaly or hepatomegaly.  Hepatitis and HIV panel negative. Platelet counts are stable.  -Continue to monitor counts at this time.  Return to clinic in 6 months with labs.

## 2024-06-20 ENCOUNTER — Other Ambulatory Visit: Payer: Self-pay

## 2024-07-18 ENCOUNTER — Ambulatory Visit

## 2024-07-18 VITALS — BP 109/73 | HR 67 | Ht 64.0 in | Wt 129.0 lb

## 2024-07-18 DIAGNOSIS — R634 Abnormal weight loss: Secondary | ICD-10-CM | POA: Diagnosis not present

## 2024-07-18 DIAGNOSIS — F205 Residual schizophrenia: Secondary | ICD-10-CM

## 2024-07-18 NOTE — Progress Notes (Signed)
 Established Patient Office Visit  Subjective   Patient ID: Gerald Pierce, male    DOB: 1961/05/20  Age: 63 y.o. MRN: [id number]  Chief Complaint  Patient presents with   Medical Management of Chronic Issues    3 month follow up    HPI   Patient Active Problem List   Diagnosis Date Noted   GERD (gastroesophageal reflux disease) 07/23/2024   Elevated bilirubin 06/04/2024   Weight loss, unintentional 04/16/2024   Seasonal allergies 01/17/2024   Hyperlipidemia 01/17/2024   Schizophrenia, residual type (HCC) 01/17/2024   Onychomycosis of right great toe 01/17/2024   Rectal polyp 12/10/2023   Vitamin B12 deficiency 12/05/2023   Thrombocytopenia 11/14/2023   Hematochezia 11/14/2023   Chronic idiopathic constipation 11/14/2023    ROS    Objective:     BP 109/73   Pulse 67   Ht 5' 4 (1.626 m)   Wt 129 lb 0.6 oz (58.5 kg)   SpO2 96%   BMI 22.15 kg/m  BP Readings from Last 3 Encounters:  07/18/24 109/73  06/04/24 114/79  04/16/24 120/70   Wt Readings from Last 3 Encounters:  07/18/24 129 lb 0.6 oz (58.5 kg)  06/04/24 127 lb 3.3 oz (57.7 kg)  04/16/24 127 lb 0.6 oz (57.6 kg)     Physical Exam Vitals and nursing note reviewed. Exam conducted with a chaperone present (accompanied by group home representative.).  Constitutional:      Appearance: Normal appearance.  HENT:     Head: Normocephalic.     Right Ear: Tympanic membrane, ear canal and external ear normal.     Left Ear: Tympanic membrane, ear canal and external ear normal.     Nose: Nose normal.     Mouth/Throat:     Mouth: Mucous membranes are moist.     Pharynx: Oropharynx is clear.  Eyes:     Extraocular Movements: Extraocular movements intact.     Pupils: Pupils are equal, round, and reactive to light.  Cardiovascular:     Rate and Rhythm: Normal rate and regular rhythm.  Pulmonary:     Effort: Pulmonary effort is normal.     Breath sounds: Normal breath sounds.  Abdominal:     General:  Bowel sounds are normal.     Palpations: Abdomen is soft.  Musculoskeletal:     Cervical back: Normal range of motion and neck supple.  Skin:    General: Skin is warm and dry.  Neurological:     Mental Status: He is alert and oriented to person, place, and time.  Psychiatric:        Mood and Affect: Mood normal.        Thought Content: Thought content normal.      No results found for any visits on 07/18/24.  Last CBC Lab Results  Component Value Date   WBC 8.8 05/28/2024   HGB 16.4 05/28/2024   HCT 46.9 05/28/2024   MCV 91.4 05/28/2024   MCH 32.0 05/28/2024   RDW 13.2 05/28/2024   PLT 98 (L) 05/28/2024   Last metabolic panel Lab Results  Component Value Date   GLUCOSE 152 (H) 05/28/2024   NA 139 05/28/2024   K 3.9 05/28/2024   CL 103 05/28/2024   CO2 24 05/28/2024   BUN 14 05/28/2024   CREATININE 0.78 05/28/2024   GFRNONAA >60 05/28/2024   CALCIUM 9.2 05/28/2024   PROT 7.0 05/28/2024   ALBUMIN 4.2 05/28/2024   LABGLOB 1.9 11/14/2023   BILITOT 1.7 (H)  05/28/2024   ALKPHOS 65 05/28/2024   AST 24 05/28/2024   ALT 33 05/28/2024   ANIONGAP 12 05/28/2024   Last lipids Lab Results  Component Value Date   CHOL  03/30/2010    194        ATP III CLASSIFICATION:  <200     mg/dL   Desirable  799-760  mg/dL   Borderline High  >=759    mg/dL   High          HDL 57 03/30/2010   LDLCALC (H) 03/30/2010    124        Total Cholesterol/HDL:CHD Risk Coronary Heart Disease Risk Table                     Men   Women  1/2 Average Risk   3.4   3.3  Average Risk       5.0   4.4  2 X Average Risk   9.6   7.1  3 X Average Risk  23.4   11.0        Use the calculated Patient Ratio above and the CHD Risk Table to determine the patient's CHD Risk.        ATP III CLASSIFICATION (LDL):  <100     mg/dL   Optimal  899-870  mg/dL   Near or Above                    Optimal  130-159  mg/dL   Borderline  839-810  mg/dL   High  >809     mg/dL   Very High   TRIG 64 93/77/7988    CHOLHDL 3.4 03/30/2010   Last hemoglobin A1c Lab Results  Component Value Date   HGBA1C  03/30/2010    5.5 (NOTE)                                                                       According to the ADA Clinical Practice Recommendations for 2011, when HbA1c is used as a screening test:   >=6.5%   Diagnostic of Diabetes Mellitus           (if abnormal result  is confirmed)  5.7-6.4%   Increased risk of developing Diabetes Mellitus  References:Diagnosis and Classification of Diabetes Mellitus,Diabetes Care,2011,34(Suppl 1):S62-S69 and Standards of Medical Care in         Diabetes - 2011,Diabetes Care,2011,34  (Suppl 1):S11-S61.    Last vitamin D  Lab Results  Component Value Date   VD25OH (L) 03/30/2010    23 (NOTE) This assay accurately quantifies Vitamin D , which is the sum of the 25-Hydroxy forms of Vitamin D2 and D3.  Studies have shown that the optimum concentration of 25-Hydroxy Vitamin D  is 30 ng/mL or higher.  Concentrations of Vitamin D  between 20  and 29 ng/mL are considered to be insufficient and concentrations less than 20 ng/mL are considered to be deficient for Vitamin D .   Last vitamin B12 and Folate Lab Results  Component Value Date   VITAMINB12 790 05/28/2024   FOLATE 27.3 11/21/2023      The ASCVD Risk score (Arnett DK, et al., 2019) failed to calculate for the following reasons:   Cannot find  a previous HDL lab   Cannot find a previous total cholesterol lab    Assessment & Plan:   Problem List Items Addressed This Visit       Other   Schizophrenia, residual type (HCC)   Followed by psychiatry in [place] (Dr. Dozier).  He is prescribed Rexulti. No behavioral issues reported by group home caregiver.       Weight loss, unintentional - Primary   Weight is stable at this time.  Advised to continue with Ensure nutritional supplement daily.         No follow-ups on file.    Leita Longs, FNP

## 2024-07-23 DIAGNOSIS — K219 Gastro-esophageal reflux disease without esophagitis: Secondary | ICD-10-CM | POA: Insufficient documentation

## 2024-07-23 NOTE — Assessment & Plan Note (Signed)
 Weight is stable at this time.  Advised to continue with Ensure nutritional supplement daily.

## 2024-07-23 NOTE — Assessment & Plan Note (Signed)
 Followed by psychiatry in [place] (Dr. Dozier).  He is prescribed Rexulti. No behavioral issues reported by group home caregiver.

## 2024-09-09 ENCOUNTER — Inpatient Hospital Stay: Attending: Oncology

## 2024-09-09 DIAGNOSIS — R17 Unspecified jaundice: Secondary | ICD-10-CM | POA: Diagnosis not present

## 2024-09-09 DIAGNOSIS — D696 Thrombocytopenia, unspecified: Secondary | ICD-10-CM | POA: Diagnosis present

## 2024-09-09 DIAGNOSIS — E538 Deficiency of other specified B group vitamins: Secondary | ICD-10-CM | POA: Diagnosis not present

## 2024-09-09 LAB — COMPREHENSIVE METABOLIC PANEL WITH GFR
ALT: 36 U/L (ref 0–44)
AST: 28 U/L (ref 15–41)
Albumin: 4.5 g/dL (ref 3.5–5.0)
Alkaline Phosphatase: 68 U/L (ref 38–126)
Anion gap: 9 (ref 5–15)
BUN: 12 mg/dL (ref 8–23)
CO2: 27 mmol/L (ref 22–32)
Calcium: 9 mg/dL (ref 8.9–10.3)
Chloride: 102 mmol/L (ref 98–111)
Creatinine, Ser: 0.88 mg/dL (ref 0.61–1.24)
GFR, Estimated: >60 mL/min (ref >60)
Glucose, Bld: 156 mg/dL — ABNORMAL HIGH (ref 70–99)
Potassium: 3.8 mmol/L (ref 3.5–5.1)
Sodium: 139 mmol/L (ref 135–145)
Total Bilirubin: 1.2 mg/dL (ref 0.0–1.2)
Total Protein: 6.6 g/dL (ref 6.5–8.1)

## 2024-09-10 ENCOUNTER — Ambulatory Visit: Admitting: Podiatry

## 2024-09-17 ENCOUNTER — Other Ambulatory Visit: Payer: Self-pay

## 2024-09-17 DIAGNOSIS — J302 Other seasonal allergic rhinitis: Secondary | ICD-10-CM

## 2024-09-17 DIAGNOSIS — E782 Mixed hyperlipidemia: Secondary | ICD-10-CM

## 2024-09-17 NOTE — Telephone Encounter (Signed)
 Copied from CRM #[id number]. Topic: Clinical - Medication Refill >> Sep 17, 2024 11:28 AM Tobias L wrote: Medication: fexofenadine (ALLEGRA) 180 MG tablet simvastatin (ZOCOR) 40 MG tablet Gerald Pierce with [facility] requesting for PCP to takeover prescriptions and sent to pharmacy below. Needing prescription to be sent in so that patient can have this medications in January. Pharmacy completes batch refills.   Has the patient contacted their pharmacy? Yes Pharmacy reached out to group home.   This is the patient's preferred pharmacy:  [facility]- [place], [place] - [place], [place] - [address] [place] [address] Phone: [phone] Fax: [phone]  Is this the correct pharmacy for this prescription? Yes  Has the prescription been filled recently? yes  Is the patient out of the medication? No  Has the patient been seen for an appointment in the last year OR does the patient have an upcoming appointment? Yes  Can we respond through MyChart? No  Agent: Please be advised that Rx refills may take up to 3 business days. We ask that you follow-up with your pharmacy.

## 2024-09-18 MED ORDER — FEXOFENADINE HCL 180 MG PO TABS: 180.0000 mg | ORAL_TABLET | Freq: Every day | ORAL | 3 refills | Status: AC

## 2024-09-26 ENCOUNTER — Ambulatory Visit: Admitting: Podiatry

## 2024-09-26 DIAGNOSIS — M79675 Pain in left toe(s): Secondary | ICD-10-CM | POA: Diagnosis not present

## 2024-09-26 DIAGNOSIS — B351 Tinea unguium: Secondary | ICD-10-CM | POA: Diagnosis not present

## 2024-09-26 DIAGNOSIS — M79674 Pain in right toe(s): Secondary | ICD-10-CM | POA: Diagnosis not present

## 2024-09-26 NOTE — Progress Notes (Signed)
"  °  Subjective:  Patient ID: Gerald Pierce, male    DOB: [birth year],  MRN: [id number]  Chief Complaint  Patient presents with   Nail Problem   63 y.o. male returns for the above complaint.  Patient presents with thickened onychodystrophy mycotic toenails x 10 mild pain on palpation worse with ambulation for pressure would like to have debride out unable to do it himself denies any other acute complaints  Objective:  There were no vitals filed for this visit. Podiatric Exam: Vascular: dorsalis pedis and posterior tibial pulses are palpable bilateral. Capillary return is immediate. Temperature gradient is WNL. Skin turgor WNL  Sensorium: Normal Semmes Weinstein monofilament test. Normal tactile sensation bilaterally. Nail Exam: Pt has thick disfigured discolored nails with subungual debris noted bilateral entire nail hallux through fifth toenails.  Pain on palpation to the nails. Ulcer Exam: There is no evidence of ulcer or pre-ulcerative changes or infection. Orthopedic Exam: Muscle tone and strength are WNL. No limitations in general ROM. No crepitus or effusions noted.  Skin: No Porokeratosis. No infection or ulcers    Assessment & Plan:   1. Pain due to onychomycosis of toenails of both feet     Patient was evaluated and treated and all questions answered.  Onychomycosis with pain  -Nails palliatively debrided as below. -Educated on self-care  Procedure: Nail Debridement Rationale: pain  Type of Debridement: manual, sharp debridement. Instrumentation: Nail nipper, rotary burr. Number of Nails: 10  Procedures and Treatment: Consent by patient was obtained for treatment procedures. The patient understood the discussion of treatment and procedures well. All questions were answered thoroughly reviewed. Debridement of mycotic and hypertrophic toenails, 1 through 5 bilateral and clearing of subungual debris. No ulceration, no infection noted.  Return Visit-Office Procedure: Patient  instructed to return to the office for a follow up visit 3 months for continued evaluation and treatment.  Gerald Pierce, DPM    No follow-ups on file.  "

## 2024-11-05 ENCOUNTER — Ambulatory Visit

## 2024-11-14 ENCOUNTER — Telehealth: Payer: Self-pay | Admitting: Family Medicine

## 2024-11-14 ENCOUNTER — Telehealth: Payer: Self-pay

## 2024-11-14 NOTE — Progress Notes (Unsigned)
" ° °  Virtual Visit via Video Note  I connected with Gerald Pierce on 11/14/24 at 11:00 AM EST by a video enabled telemedicine application and verified that I am speaking with the correct person using two identifiers.  {Patient [phone]::Home} {Provider Location:[phone]::Home Office}  I discussed the limitations, risks, security, and privacy concerns of performing an evaluation and management service by video and the availability of in person appointments. I also discussed with the patient that there may be a patient responsible charge related to this service. The patient expressed understanding and agreed to proceed.  Subjective: PCP: Bevely Doffing, FNP  No chief complaint on file.  HPI   ROS: Per HPI Current Medications[1]  Observations/Objective: There were no vitals filed for this visit. Physical Exam  Assessment and Plan: There are no diagnoses linked to this encounter.  Follow Up Instructions: No follow-ups on file.   I discussed the assessment and treatment plan with the patient. The patient was provided an opportunity to ask questions, and all were answered. The patient agreed with the plan and demonstrated an understanding of the instructions.   The patient was advised to call back or seek an in-person evaluation if the symptoms worsen or if the condition fails to improve as anticipated.  The above assessment and management plan was discussed with the patient. The patient verbalized understanding of and has agreed to the management plan.   Gerald Pierce  Z Bacchus, FNP    [1]  Current Outpatient Medications:    acetaminophen  (TYLENOL ) 325 MG tablet, Take 2 tablets (650 mg total) by mouth 2 (two) times daily as needed., Disp: 180 tablet, Rfl: 0   betamethasone valerate (VALISONE) 0.1 % cream, Apply topically 2 (two) times daily., Disp: , Rfl:    bisacodyl (DULCOLAX) 5 MG EC tablet, Take by mouth., Disp: , Rfl:    ciclopirox (PENLAC) 8 % solution, Apply  topically at bedtime. Apply over nail and surrounding skin. Apply daily over previous coat. After seven (7) days, may remove with alcohol  and continue cycle., Disp: , Rfl:    cyanocobalamin  (VITAMIN B12) 1000 MCG tablet, Take 1 tablet (1,000 mcg total) by mouth daily., Disp: 90 tablet, Rfl: 2   fexofenadine  (ALLEGRA ) 180 MG tablet, Take 1 tablet (180 mg total) by mouth daily., Disp: 90 tablet, Rfl: 3   hydrOXYzine  (ATARAX ) 10 MG tablet, Take 1 tablet (10 mg total) by mouth every 6 (six) hours as needed., Disp: 180 tablet, Rfl: 0   Nutritional Supplements (ENSURE HIGH PROTEIN) LIQD, Take 1 Bottle by mouth daily., Disp: 7110 mL, Rfl: 11   ondansetron  (ZOFRAN ) 4 MG tablet, TAKE 1 TABLET BY MOUTH EVERY 8 HOURS AS NEEDED FOR NAUSEA AND VOMITING, Disp: 20 tablet, Rfl: 0   REXULTI 1 MG TABS tablet, Take 1 mg by mouth daily., Disp: , Rfl:    simvastatin (ZOCOR) 40 MG tablet, Take 1 tablet (40 mg total) by mouth at bedtime., Disp: 90 tablet, Rfl: 3   terbinafine  (LAMISIL ) 250 MG tablet, Take 1 tablet (250 mg total) by mouth daily., Disp: 90 tablet, Rfl: 0   terbinafine  (LAMISIL ) 250 MG tablet, Take 1 tablet (250 mg total) by mouth daily., Disp: 90 tablet, Rfl: 0   triamcinolone  cream (KENALOG) 0.1 %, Apply 1 Application topically 2 (two) times daily., Disp: , Rfl:    vitamin C (ASCORBIC ACID) 500 MG tablet, Take 500 mg by mouth daily., Disp: , Rfl:   "

## 2024-11-14 NOTE — Telephone Encounter (Signed)
In provider folder

## 2024-11-14 NOTE — Telephone Encounter (Signed)
 [facility]  Healthcare Appointment form Copied Noted Originally placed provider box [email or web address]) Copy placed front desk folder  Call [phone] when ready

## 2024-11-26 ENCOUNTER — Inpatient Hospital Stay: Attending: Oncology

## 2024-12-03 ENCOUNTER — Inpatient Hospital Stay: Admitting: Oncology

## 2025-01-07 ENCOUNTER — Ambulatory Visit: Admitting: Podiatry

## 2025-02-26 ENCOUNTER — Ambulatory Visit: Payer: Self-pay
